# Patient Record
Sex: Male | Born: 1970 | Race: Black or African American | Marital: Single | Smoking: Current every day smoker
Health system: Southern US, Community
[De-identification: ages and names within clinical notes are randomized; demographics above are authoritative.]

## PROBLEM LIST (undated history)

## (undated) DIAGNOSIS — M1990 Unspecified osteoarthritis, unspecified site: Secondary | ICD-10-CM

## (undated) DIAGNOSIS — D649 Anemia, unspecified: Secondary | ICD-10-CM

## (undated) DIAGNOSIS — E559 Vitamin D deficiency, unspecified: Secondary | ICD-10-CM

## (undated) DIAGNOSIS — E7800 Pure hypercholesterolemia, unspecified: Secondary | ICD-10-CM

## (undated) DIAGNOSIS — I10 Essential (primary) hypertension: Secondary | ICD-10-CM

## (undated) HISTORY — DX: Vitamin D deficiency, unspecified: E55.9

## (undated) HISTORY — PX: OTHER SURGICAL HISTORY: SHX169

## (undated) HISTORY — DX: Unspecified osteoarthritis, unspecified site: M19.90

## (undated) HISTORY — DX: Anemia, unspecified: D64.9

## (undated) HISTORY — PX: WRIST RECONSTRUCTION: SHX2675

---

## 2012-02-20 ENCOUNTER — Emergency Department (HOSPITAL_COMMUNITY)
Admission: EM | Admit: 2012-02-20 | Discharge: 2012-02-20 | Disposition: A | Discharge status: Home / Self Care | Payer: Self-pay | Attending: Emergency Medicine | Admitting: Emergency Medicine

## 2012-02-20 ENCOUNTER — Encounter (HOSPITAL_COMMUNITY): Payer: Self-pay | Admitting: *Deleted

## 2012-02-20 ENCOUNTER — Emergency Department (HOSPITAL_COMMUNITY): Payer: Self-pay

## 2012-02-20 DIAGNOSIS — F17200 Nicotine dependence, unspecified, uncomplicated: Secondary | ICD-10-CM | POA: Insufficient documentation

## 2012-02-20 DIAGNOSIS — X500XXA Overexertion from strenuous movement or load, initial encounter: Secondary | ICD-10-CM | POA: Insufficient documentation

## 2012-02-20 DIAGNOSIS — Y929 Unspecified place or not applicable: Secondary | ICD-10-CM | POA: Insufficient documentation

## 2012-02-20 DIAGNOSIS — Z862 Personal history of diseases of the blood and blood-forming organs and certain disorders involving the immune mechanism: Secondary | ICD-10-CM | POA: Insufficient documentation

## 2012-02-20 DIAGNOSIS — Z8639 Personal history of other endocrine, nutritional and metabolic disease: Secondary | ICD-10-CM | POA: Insufficient documentation

## 2012-02-20 DIAGNOSIS — I10 Essential (primary) hypertension: Secondary | ICD-10-CM | POA: Insufficient documentation

## 2012-02-20 DIAGNOSIS — Y93F2 Activity, caregiving, lifting: Secondary | ICD-10-CM | POA: Insufficient documentation

## 2012-02-20 DIAGNOSIS — S335XXA Sprain of ligaments of lumbar spine, initial encounter: Secondary | ICD-10-CM | POA: Insufficient documentation

## 2012-02-20 DIAGNOSIS — S39012A Strain of muscle, fascia and tendon of lower back, initial encounter: Secondary | ICD-10-CM

## 2012-02-20 HISTORY — DX: Pure hypercholesterolemia, unspecified: E78.00

## 2012-02-20 HISTORY — DX: Essential (primary) hypertension: I10

## 2012-02-20 MED ORDER — NAPROXEN 500 MG PO TABS: 500.0000 mg | ORAL_TABLET | Freq: Two times a day (BID) | ORAL | Status: DC

## 2012-02-20 MED ORDER — HYDROCODONE-ACETAMINOPHEN 5-325 MG PO TABS: 1.0000 | ORAL_TABLET | Freq: Four times a day (QID) | ORAL | Status: DC | PRN

## 2012-02-20 MED ORDER — CYCLOBENZAPRINE HCL 10 MG PO TABS: 10.0000 mg | ORAL_TABLET | Freq: Two times a day (BID) | ORAL | Status: DC | PRN

## 2012-02-20 NOTE — ED Notes (Signed)
Pt was pushing a wheelbarrow and lifting boxes of tiles 2 weeks ago when he began having lower back pain.  Pain is greatest when he lays down or changes positions.

## 2012-02-20 NOTE — ED Provider Notes (Signed)
History  This chart was scribed for Shelda Jakes, MD by Erskine Emery, ED Scribe. This patient was seen in room TR05C/TR05C and the patient's care was started at 16:16.   CSN: [id number]  Arrival date & time 02/20/12  1552   First MD Initiated Contact with Patient 02/20/12 1616      Chief Complaint  Patient presents with  . Back Pain    (Consider location/radiation/quality/duration/timing/severity/associated sxs/prior treatment) The history is provided by the patient. No language interpreter was used.  Jeffrey Alvarez is a 42 y.o. male who presents to the Emergency Department complaining of gradually worsening constant lower right sided back pain that radiates to the right hip and right knee with a stabbing sensation for the past 2 weeks, since pushing a wheelbarrow and lifting heavy boxes. Pt reports the pain is about a 7.5/10 at this time. He claims it is aggravated by laying down or changing positions and used to be relieved by certain positions where he feels something move and getting warmed up by moving around. Pt denies any associated numbness or tingling in the feet, neck pain, chest pain, trouble breathing, nausea, emesis, diarrhea, fever, rash, visual disturbances, dysuria, or h/o bleeding easily. Pt reports a h/o pulled muscles in his back and several operations. He has been trying advil, tylenol, aleve, BC, and back rubs with no lasting relief from pain.  Past Medical History  Diagnosis Date  . Hypertension   . Hypercholesterolemia     Past Surgical History  Procedure Date  . Wrist reconstruction   . Knee reconsruction     L    No family history on file.  History  Substance Use Topics  . Smoking status: Current Every Day Smoker -- 0.5 packs/day    Types: Cigarettes  . Smokeless tobacco: Not on file  . Alcohol Use: Yes     Comment: weekends      Review of Systems  Constitutional: Negative for fever.  HENT: Negative for neck pain.   Eyes: Negative for visual  disturbance.  Respiratory: Negative for shortness of breath.   Cardiovascular: Negative for chest pain.  Gastrointestinal: Negative for nausea, vomiting, abdominal pain and diarrhea.  Genitourinary: Negative for dysuria.  Musculoskeletal: Positive for back pain.  Skin: Negative for rash.  Neurological: Negative for numbness.  Hematological: Does not bruise/bleed easily.  Psychiatric/Behavioral: Negative for sleep disturbance.  All other systems reviewed and are negative.    Allergies  Review of patient's allergies indicates no known allergies.  Home Medications   Current Outpatient Rx  Name  Route  Sig  Dispense  Refill  . CYCLOBENZAPRINE HCL 10 MG PO TABS   Oral   Take 1 tablet (10 mg total) by mouth 2 (two) times daily as needed for muscle spasms.   20 tablet   0   . HYDROCODONE-ACETAMINOPHEN 5-325 MG PO TABS   Oral   Take 1-2 tablets by mouth every 6 (six) hours as needed for pain.   20 tablet   0   . NAPROXEN 500 MG PO TABS   Oral   Take 1 tablet (500 mg total) by mouth 2 (two) times daily.   14 tablet   0     Triage Vitals: BP 157/100  Pulse 94  Temp 98.2 F (36.8 C) (Oral)  Resp 16  SpO2 94%  Physical Exam  Nursing note and vitals reviewed. Constitutional: He is oriented to person, place, and time. He appears well-developed and well-nourished. No distress.  HENT:  Head:  Normocephalic and atraumatic.  Mouth/Throat: Oropharynx is clear and moist.  Eyes: EOM are normal. Pupils are equal, round, and reactive to light.  Neck: Neck supple. No tracheal deviation present.  Cardiovascular: Normal rate, regular rhythm and normal heart sounds.   No murmur heard. Pulmonary/Chest: Effort normal and breath sounds normal. No respiratory distress. He has no wheezes.  Abdominal: Soft. Bowel sounds are normal. He exhibits no distension. There is no tenderness.  Musculoskeletal: Normal range of motion. He exhibits no edema.  Lymphadenopathy:    He has no cervical  adenopathy.  Neurological: He is alert and oriented to person, place, and time. No cranial nerve deficit. Coordination normal.  Skin: Skin is warm and dry.  Psychiatric: He has a normal mood and affect.    ED Course  Procedures (including critical care time) DIAGNOSTIC STUDIES: Oxygen Saturation is 94% on room air, adequate by my interpretation.    COORDINATION OF CARE: 16:30--I evaluated the patient and we discussed a treatment plan including back x-ray, consistent antiinflammatory medications, and narcotic pain medication to which the pt agreed.    Labs Reviewed - No data to display Dg Lumbar Spine Complete  02/20/2012  *RADIOLOGY REPORT*  Clinical Data: Low back pain.  LUMBAR SPINE - COMPLETE 4+ VIEW  Comparison: None.  Findings: AP, lateral and oblique images of the lumbar spine were obtained.  Normal alignment of the lumbar spine.  Vertebral body heights are maintained.  There is vacuum disc phenomenon and disc space narrowing at L5-S1.  No evidence for a fracture or dislocation.  IMPRESSION: Degenerative disc disease at L5-S1.  No acute bony abnormality.   Original Report Authenticated By: Richarda Overlie, M.D.      1. Lumbar strain       MDM   Patient's a lumbar x-rays are negative for any acute abnormality does have some degenerative disc disease L5-S1 symptoms are consistent with a lumbar strain O2 sat component. Patient given resource guide an orthopedic followup. Also had recommended maybe following up with the chiropractor. Patient retrieve the Flexeril pain medicine anti-inflammatories.      I personally performed the services described in this documentation, which was scribed in my presence. The recorded information has been reviewed and is accurate.     Shelda Jakes, MD 02/20/12 [phone]

## 2013-06-19 IMAGING — CR DG LUMBAR SPINE COMPLETE 4+V
5 series · 5 of 5 positions shown · non-contrast
Comparison: None.

CLINICAL DATA: Low back pain.

LUMBAR SPINE - COMPLETE 4+ VIEW

[t l-spine a.p.]
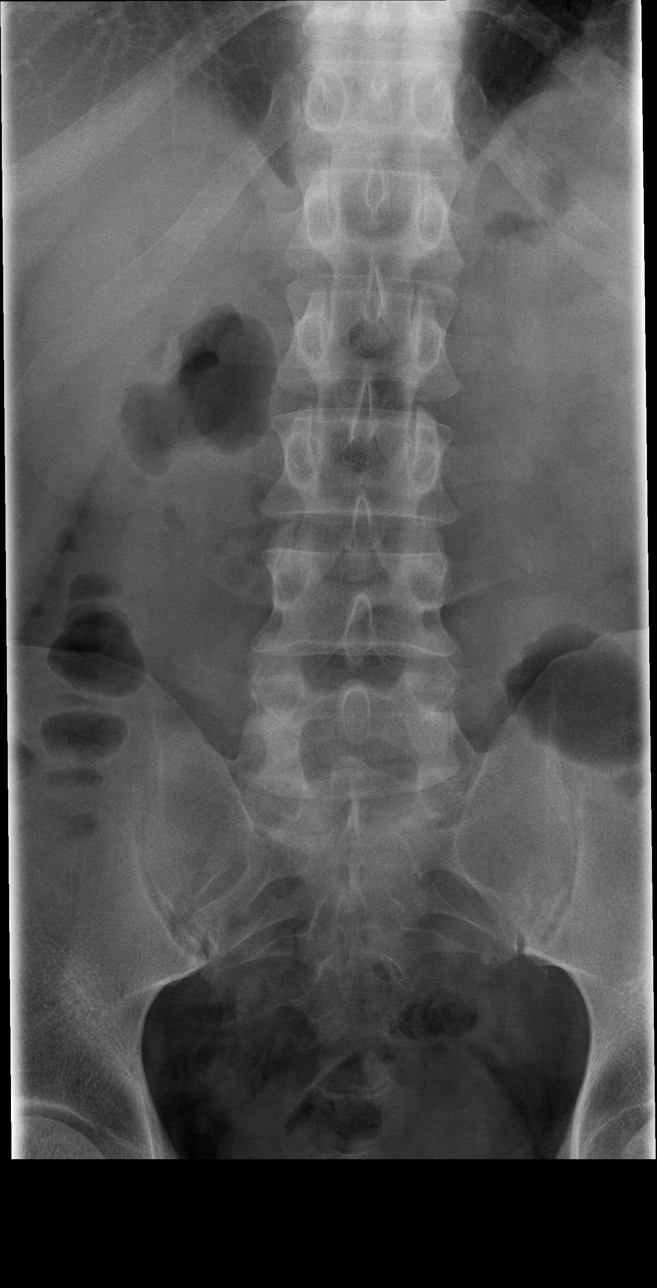

[t l-spine oblique exposure (1 of 2)]
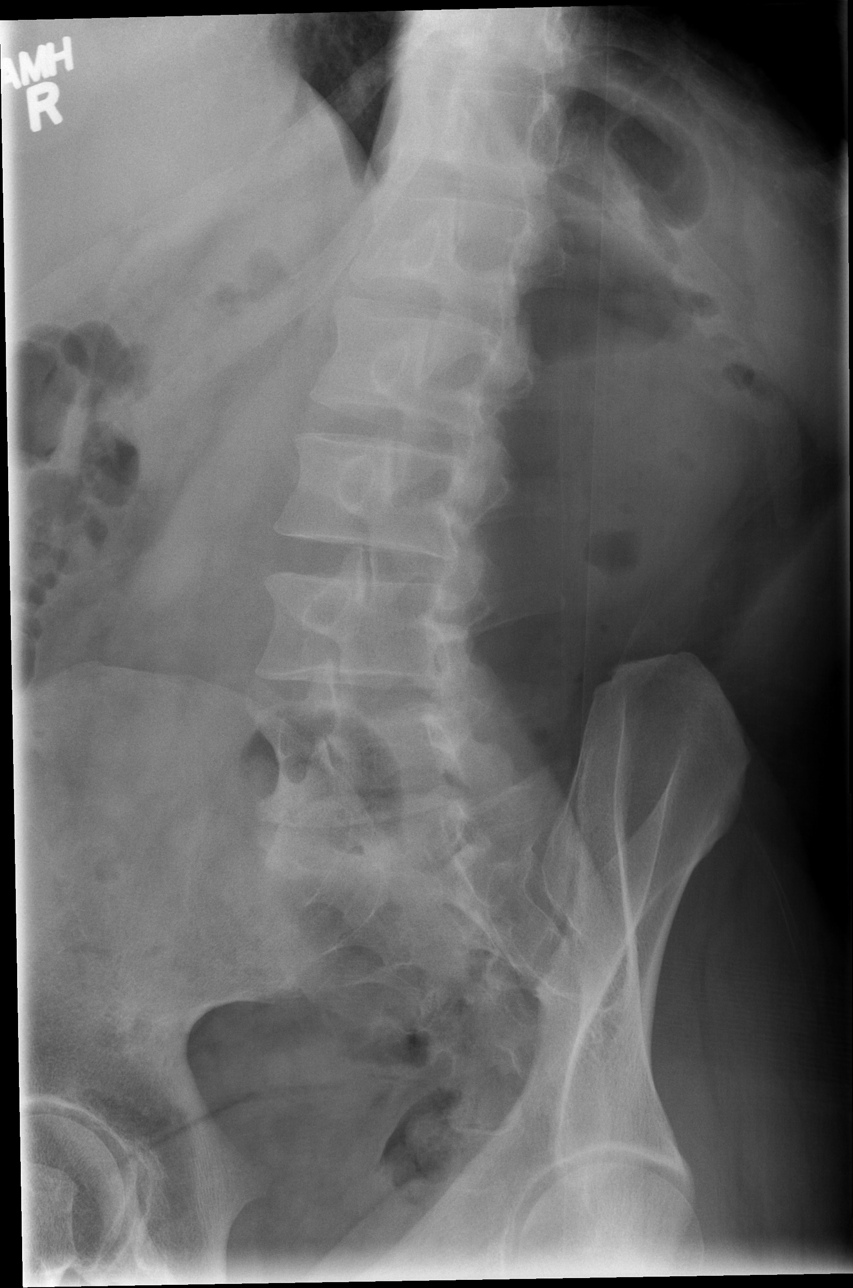

[t l-spine oblique exposure (2 of 2)]
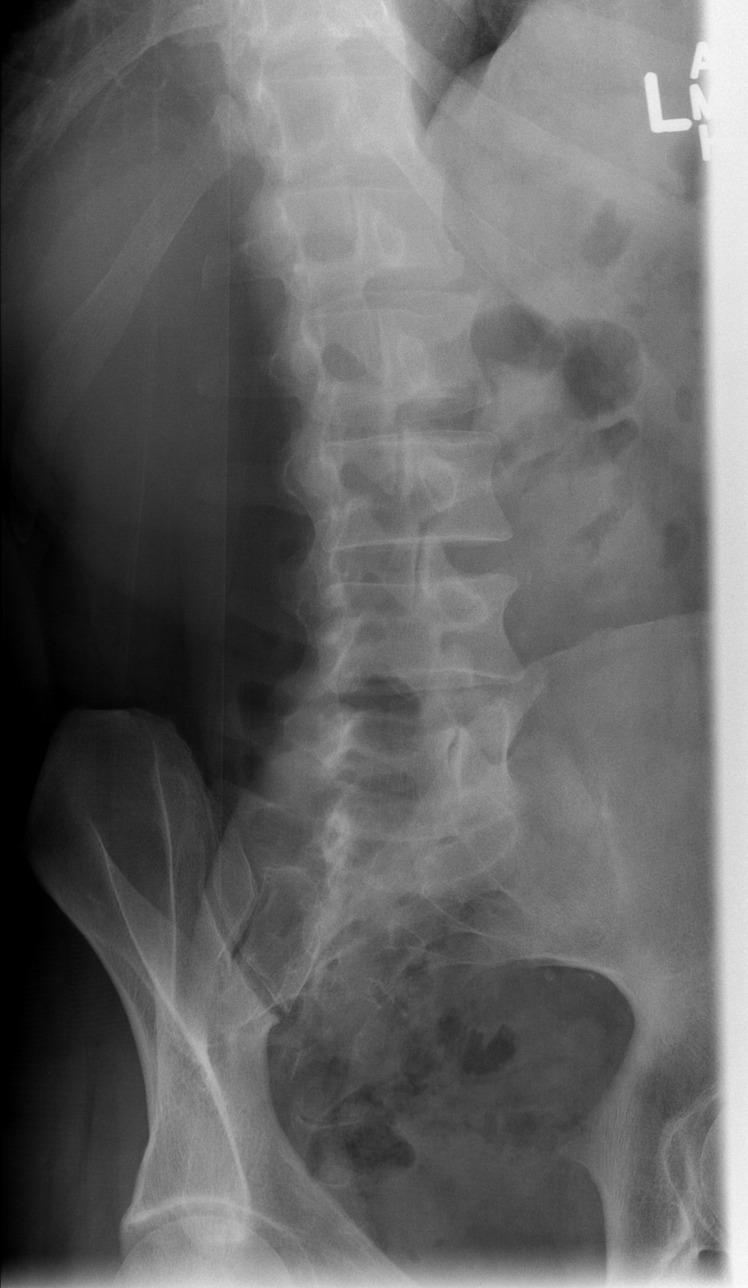

[t l-spine lat]
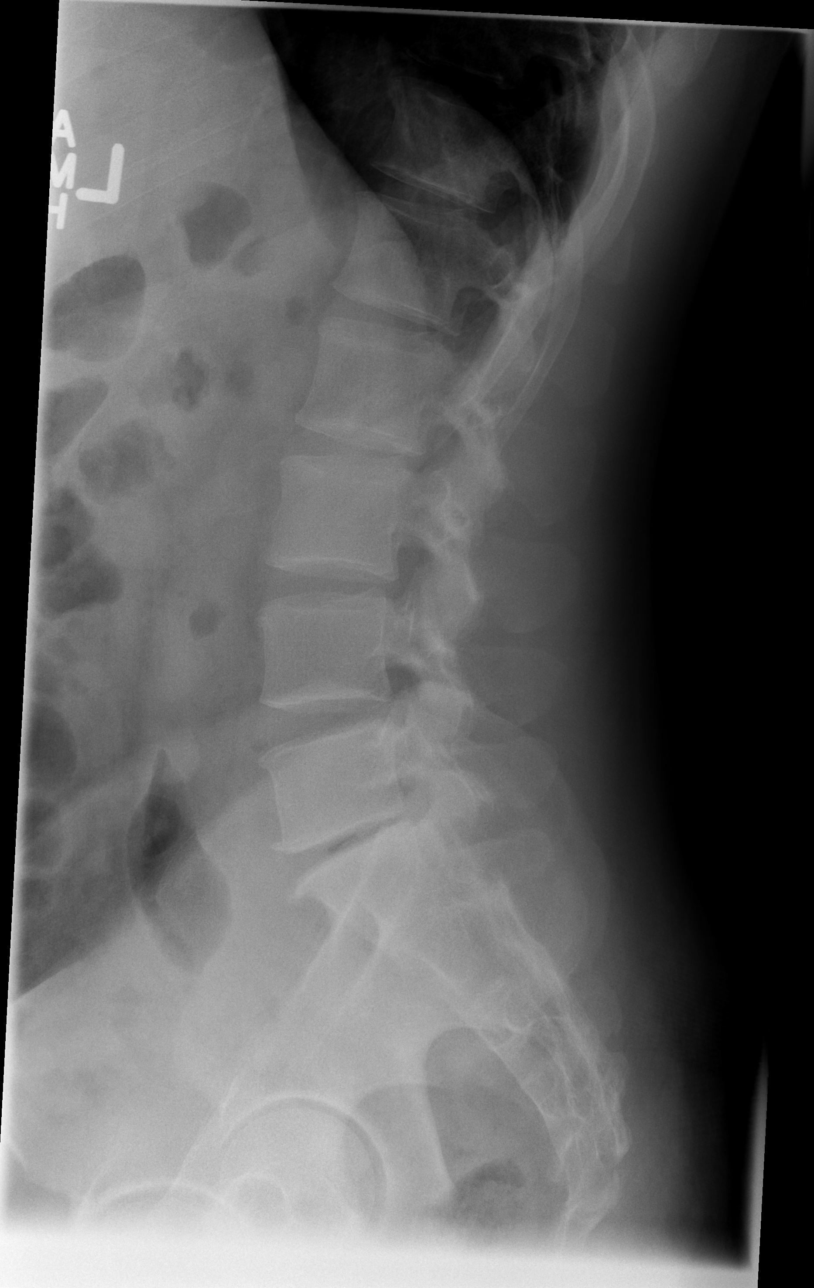

[t l-spine l5-s1 spot]
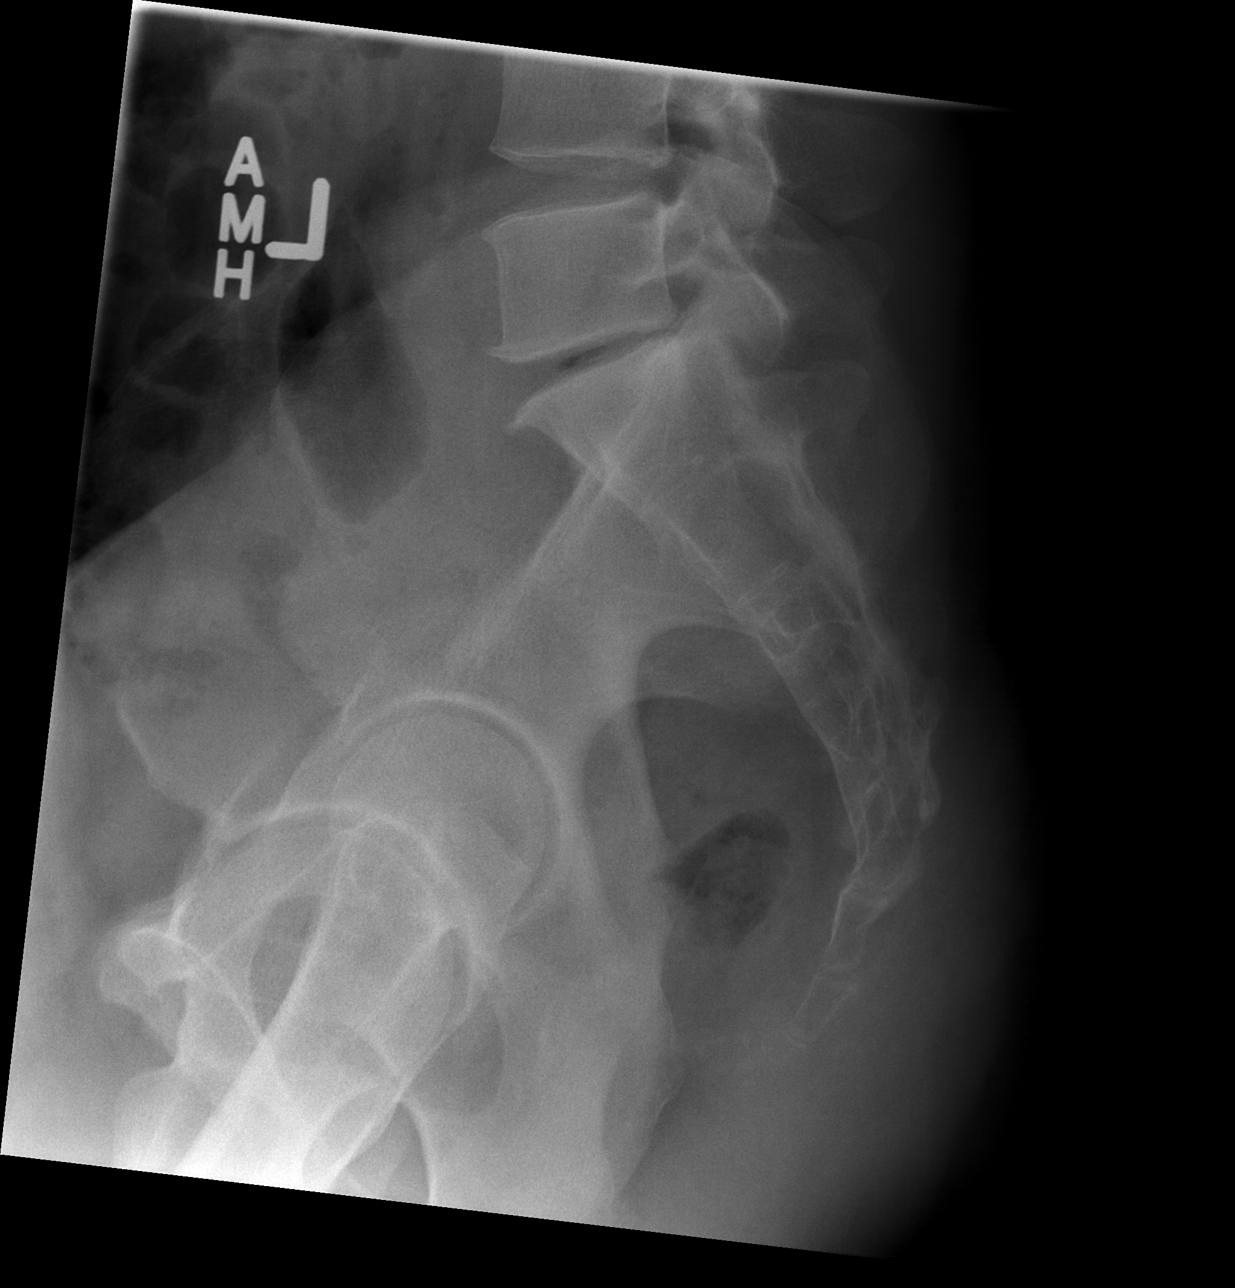

[5 of 5 positions shown; findings below may reference images not displayed]

FINDINGS: AP, lateral and oblique images of the lumbar spine were
obtained.  Normal alignment of the lumbar spine.  Vertebral body
heights are maintained.  There is vacuum disc phenomenon and disc
space narrowing at L5-S1.  No evidence for a fracture or
dislocation.
IMPRESSION: Degenerative disc disease at L5-S1.

No acute bony abnormality.

## 2022-08-17 NOTE — Progress Notes (Addendum)
[facility] [address] [place],  [place]  [address] [phone]  Clinic Day:  08/17/2022  Referring physician: Ellis Parents, FNP   REASON FOR CONSULTATION:  Thrombocytopenia  HISTORY OF PRESENT ILLNESS:  Jeffrey Alvarez is a 52 y.o. male with thrombocytopenia who is referred in consultation by Jeffrey Endo, FNP for assessment and management.  At a routine visit on July 23, the patient complained of weight loss, cough, and shortness of breath.  A CBC revealed a white count of 6.7 with 34% neutrophils, 58% lymphocytes, 7% monocytes and 1% eosinophils.  Hemoglobin was 13.4 with an MCV of 99.  Platelet count was 91,000, however, platelet clumping was seen.  Comprehensive metabolic panel was normal except for an AST of 79 and alkaline phosphatase of 203.  Iron panel, ferritin and B12 were normal.  Folic acid was borderline at 3.1 with greater than 3 being normal.  Hepatitis panel was negative.  CT chest did not reveal any acute cardiopulmonary abnormality.  No masses or nodules were seen.  Scattered pulmonary scarring was present.  Review of his medical records revealed he was seen in the ER at [facility] in March 2022 with cough, weakness and shortness of breath.  ABC revealed a white count of 4.6 with a normal differential, hemoglobin 13.8 and platelets 91,000.  Complete metabolic panel was normal except for ALT 140 and AST 370.  Chest x-ray did not reveal any acute abnormality.  7 mm right middle lobe nodule, felt to be possible nipple shadow was seen.  Medical history: Hypertension, hyperlipidemia, osteoarthritis, bipolar depression/anxiety and vitamin D deficiency. Status post left knee and right wrist surgeries.  Social history:  REVIEW OF SYSTEMS:  Review of Systems - Oncology   VITALS:  There were no vitals taken for this visit.  Wt Readings from Last 3 Encounters:  No data found for Wt    There is no height or weight on file to calculate  BMI.  Performance status (ECOG): {CHL ONC [id number]  PHYSICAL EXAM:  Physical Exam   LABS:       No data to display             No data to display           No results found for: "CEA1", "CEA" / No results found for: "CEA1", "CEA" No results found for: "PSA1" No results found for: "ZHY865" No results found for: "CAN125"  No results found for: "TOTALPROTELP", "ALBUMINELP", "A1GS", "A2GS", "BETS", "BETA2SER", "GAMS", "MSPIKE", "SPEI" No results found for: "TIBC", "FERRITIN", "IRONPCTSAT" No results found for: "LDH"  STUDIES:  No results found.    HISTORY:   Past Medical History:  Diagnosis Date   Hypercholesterolemia    Hypertension     Past Surgical History:  Procedure Laterality Date   knee reconsruction     L   WRIST RECONSTRUCTION      No family history on file.  Social History:  reports that he has been smoking cigarettes. He does not have any smokeless tobacco history on file. He reports current alcohol use. He reports that he does not use drugs.The patient is {Blank single:19197::"alone","accompanied by"} *** today.  Allergies: No Known Allergies  Current Medications: Current Outpatient Medications  Medication Sig Dispense Refill   cyclobenzaprine (FLEXERIL) 10 MG tablet Take 1 tablet (10 mg total) by mouth 2 (two) times daily as needed for muscle spasms. 20 tablet 0   HYDROcodone-acetaminophen (NORCO/VICODIN) 5-325 MG per tablet Take 1-2 tablets by  mouth every 6 (six) hours as needed for pain. 20 tablet 0   naproxen (NAPROSYN) 500 MG tablet Take 1 tablet (500 mg total) by mouth 2 (two) times daily. 14 tablet 0   No current facility-administered medications for this visit.     ASSESSMENT & PLAN:   Assessment:  Jeffrey Alvarez is a 52 y.o. male with thrombocytopenia, which is likely due to platelet clumping.  However, he has lymphocytosis, as well as macrocytosis.  The differential is fairly wide including infection, liver disease,  leukemia or myeloproliferative neoplasm.  Plan: 1.  Evaluate for nutritional and trace element deficiency, viral infection and liver disease, as well as leukemia with a CBC, CMP, B12, folate, copper, HIV, CMV, PT, PTT, fibrinogen, peripheral smear, flow cytometry on peripheral blood and abdominal ultrasound. 2.  Return in about 2 weeks to see Dr. Angelene Alvarez for results and further dilation/treatment recommendations.   I discussed the assessment and plan with the patient.  The patient was provided an opportunity to ask questions and all were answered.  The patient agreed with the plan and demonstrated an understanding of the instructions.  Was staffed and plan formulated with Dr. Angelene Alvarez.  Thank you for the referral.    I provided *** minutes of face-to-face time during this encounter and > 50% was spent counseling as documented under my assessment and plan.    Jeffrey Perl, PA-C   Physician Assistant [facility] [place] [phone]

## 2022-08-18 ENCOUNTER — Inpatient Hospital Stay: Payer: Medicaid Other | Attending: Hematology and Oncology | Admitting: Hematology and Oncology

## 2022-08-18 ENCOUNTER — Inpatient Hospital Stay: Payer: Medicaid Other

## 2022-08-18 ENCOUNTER — Encounter: Payer: Self-pay | Admitting: Hematology and Oncology

## 2022-08-18 VITALS — BP 197/102 | HR 56 | Temp 98.8°F | Resp 16 | Ht 71.5 in | Wt 146.4 lb

## 2022-08-18 DIAGNOSIS — D539 Nutritional anemia, unspecified: Secondary | ICD-10-CM | POA: Diagnosis present

## 2022-08-18 DIAGNOSIS — M1990 Unspecified osteoarthritis, unspecified site: Secondary | ICD-10-CM | POA: Diagnosis not present

## 2022-08-18 DIAGNOSIS — R7401 Elevation of levels of liver transaminase levels: Secondary | ICD-10-CM

## 2022-08-18 DIAGNOSIS — Z83438 Family history of other disorder of lipoprotein metabolism and other lipidemia: Secondary | ICD-10-CM | POA: Diagnosis not present

## 2022-08-18 DIAGNOSIS — Z809 Family history of malignant neoplasm, unspecified: Secondary | ICD-10-CM

## 2022-08-18 DIAGNOSIS — R748 Abnormal levels of other serum enzymes: Secondary | ICD-10-CM

## 2022-08-18 DIAGNOSIS — F17210 Nicotine dependence, cigarettes, uncomplicated: Secondary | ICD-10-CM | POA: Insufficient documentation

## 2022-08-18 DIAGNOSIS — F1290 Cannabis use, unspecified, uncomplicated: Secondary | ICD-10-CM | POA: Diagnosis not present

## 2022-08-18 DIAGNOSIS — J984 Other disorders of lung: Secondary | ICD-10-CM | POA: Diagnosis not present

## 2022-08-18 DIAGNOSIS — R7989 Other specified abnormal findings of blood chemistry: Secondary | ICD-10-CM | POA: Diagnosis not present

## 2022-08-18 DIAGNOSIS — R600 Localized edema: Secondary | ICD-10-CM | POA: Diagnosis not present

## 2022-08-18 DIAGNOSIS — E559 Vitamin D deficiency, unspecified: Secondary | ICD-10-CM | POA: Diagnosis not present

## 2022-08-18 DIAGNOSIS — R0602 Shortness of breath: Secondary | ICD-10-CM

## 2022-08-18 DIAGNOSIS — Z8249 Family history of ischemic heart disease and other diseases of the circulatory system: Secondary | ICD-10-CM | POA: Insufficient documentation

## 2022-08-18 DIAGNOSIS — Z5941 Food insecurity: Secondary | ICD-10-CM | POA: Diagnosis not present

## 2022-08-18 DIAGNOSIS — D649 Anemia, unspecified: Secondary | ICD-10-CM | POA: Diagnosis not present

## 2022-08-18 DIAGNOSIS — Z125 Encounter for screening for malignant neoplasm of prostate: Secondary | ICD-10-CM

## 2022-08-18 DIAGNOSIS — D72820 Lymphocytosis (symptomatic): Secondary | ICD-10-CM | POA: Diagnosis not present

## 2022-08-18 DIAGNOSIS — Z8041 Family history of malignant neoplasm of ovary: Secondary | ICD-10-CM | POA: Diagnosis not present

## 2022-08-18 DIAGNOSIS — Z8261 Family history of arthritis: Secondary | ICD-10-CM | POA: Insufficient documentation

## 2022-08-18 DIAGNOSIS — Z841 Family history of disorders of kidney and ureter: Secondary | ICD-10-CM | POA: Insufficient documentation

## 2022-08-18 DIAGNOSIS — D696 Thrombocytopenia, unspecified: Secondary | ICD-10-CM | POA: Diagnosis not present

## 2022-08-18 DIAGNOSIS — Z5982 Transportation insecurity: Secondary | ICD-10-CM | POA: Insufficient documentation

## 2022-08-18 DIAGNOSIS — Z833 Family history of diabetes mellitus: Secondary | ICD-10-CM | POA: Insufficient documentation

## 2022-08-18 DIAGNOSIS — Z803 Family history of malignant neoplasm of breast: Secondary | ICD-10-CM | POA: Diagnosis not present

## 2022-08-18 DIAGNOSIS — Z8042 Family history of malignant neoplasm of prostate: Secondary | ICD-10-CM | POA: Insufficient documentation

## 2022-08-18 DIAGNOSIS — Z8349 Family history of other endocrine, nutritional and metabolic diseases: Secondary | ICD-10-CM

## 2022-08-18 DIAGNOSIS — Z79899 Other long term (current) drug therapy: Secondary | ICD-10-CM | POA: Diagnosis not present

## 2022-08-18 DIAGNOSIS — Z808 Family history of malignant neoplasm of other organs or systems: Secondary | ICD-10-CM | POA: Insufficient documentation

## 2022-08-18 LAB — RETICULOCYTES
Immature Retic Fract: 12.4 % (ref 2.3–15.9)
RBC.: 4.17 MIL/uL — ABNORMAL LOW (ref 4.22–5.81)
Retic Count, Absolute: 84.7 10*3/uL (ref 19.0–186.0)
Retic Ct Pct: 2 % (ref 0.4–3.1)

## 2022-08-18 LAB — DIRECT ANTIGLOBULIN TEST (NOT AT ARMC)
DAT, IgG: NEGATIVE
DAT, complement: NEGATIVE

## 2022-08-18 LAB — CBC WITH DIFFERENTIAL (CANCER CENTER ONLY)
Abs Immature Granulocytes: 0.04 10*3/uL (ref 0.00–0.07)
Basophils Absolute: 0 10*3/uL (ref 0.0–0.1)
Basophils Relative: 0 %
Eosinophils Absolute: 0.1 10*3/uL (ref 0.0–0.5)
Eosinophils Relative: 1 %
HCT: 41.6 % (ref 39.0–52.0)
Hemoglobin: 13.7 g/dL (ref 13.0–17.0)
Immature Granulocytes: 0 %
Lymphocytes Relative: 29 %
Lymphs Abs: 2.8 10*3/uL (ref 0.7–4.0)
MCH: 33.3 pg (ref 26.0–34.0)
MCHC: 32.9 g/dL (ref 30.0–36.0)
MCV: 101.2 fL — ABNORMAL HIGH (ref 80.0–100.0)
Monocytes Absolute: 0.8 10*3/uL (ref 0.1–1.0)
Monocytes Relative: 8 %
Neutro Abs: 6 10*3/uL (ref 1.7–7.7)
Neutrophils Relative %: 62 %
Platelet Count: 106 10*3/uL — ABNORMAL LOW (ref 150–400)
RBC: 4.11 MIL/uL — ABNORMAL LOW (ref 4.22–5.81)
RDW: 15.2 % (ref 11.5–15.5)
WBC Count: 9.7 10*3/uL (ref 4.0–10.5)
nRBC: 0 % (ref 0.0–0.2)

## 2022-08-18 LAB — CMP (CANCER CENTER ONLY)
ALT: 24 U/L (ref 0–44)
AST: 36 U/L (ref 15–41)
Albumin: 3.8 g/dL (ref 3.5–5.0)
Alkaline Phosphatase: 117 U/L (ref 38–126)
Anion gap: 9 (ref 5–15)
BUN: 8 mg/dL (ref 6–20)
CO2: 24 mmol/L (ref 22–32)
Calcium: 9.2 mg/dL (ref 8.9–10.3)
Chloride: 108 mmol/L (ref 98–111)
Creatinine: 0.66 mg/dL (ref 0.61–1.24)
GFR, Estimated: >60 mL/min (ref >60)
Glucose, Bld: 88 mg/dL (ref 70–99)
Potassium: 3.8 mmol/L (ref 3.5–5.1)
Sodium: 141 mmol/L (ref 135–145)
Total Bilirubin: 1 mg/dL (ref 0.3–1.2)
Total Protein: 7.5 g/dL (ref 6.5–8.1)

## 2022-08-18 LAB — D-DIMER, QUANTITATIVE: D-Dimer, Quant: 1.23 ug/mL-FEU — ABNORMAL HIGH (ref 0.00–0.50)

## 2022-08-18 LAB — HIV ANTIBODY (ROUTINE TESTING W REFLEX): HIV Screen 4th Generation wRfx: NONREACTIVE

## 2022-08-18 LAB — TECHNOLOGIST SMEAR REVIEW: Plt Morphology: DECREASED

## 2022-08-18 LAB — LACTATE DEHYDROGENASE: LDH: 183 U/L (ref 98–192)

## 2022-08-18 LAB — VITAMIN B12: Vitamin B-12: 266 pg/mL (ref 180–914)

## 2022-08-18 LAB — FOLATE: Folate: 7.1 ng/mL (ref >5.9)

## 2022-08-18 LAB — FIBRINOGEN: Fibrinogen: 322 mg/dL (ref 210–475)

## 2022-08-18 LAB — PROTIME-INR
INR: 1.2 (ref 0.8–1.2)
Prothrombin Time: 14.9 seconds (ref 11.4–15.2)

## 2022-08-18 LAB — FERRITIN: Ferritin: 58 ng/mL (ref 24–336)

## 2022-08-18 LAB — APTT: aPTT: 27 seconds (ref 24–36)

## 2022-08-19 ENCOUNTER — Telehealth: Payer: Self-pay | Admitting: Hematology and Oncology

## 2022-08-19 ENCOUNTER — Telehealth: Payer: Self-pay

## 2022-08-19 NOTE — Progress Notes (Signed)
Sent in request for DOS 08/19/2022 to [employer].

## 2022-08-19 NOTE — Telephone Encounter (Signed)
Attempted to contact patient to let him know he does not have evidence of DVT or pulmonary embolism.  Phone number listed on the chart was not in service.

## 2022-08-19 NOTE — Telephone Encounter (Signed)
08/19/22 Spoke with patient and scheduled CTA Chest and Bilat venous doppler on 08/19/22@1pm 

## 2022-08-19 NOTE — Telephone Encounter (Signed)
Pt walked into clinic after his scan. He c/o bilateral legs & feet hurting him. He was wondering if you could give him something for pain to get him through the weekend. Tylenol & ibuprofen do not relieve the pain. Pt has on compression stockings.He states when he takes the stockings off his feet swell. His PCP closed for weekend, which is [facility] @ [facility].  Please advise.

## 2022-08-23 ENCOUNTER — Encounter: Payer: Self-pay | Admitting: Hematology and Oncology

## 2022-08-23 ENCOUNTER — Telehealth: Payer: Self-pay

## 2022-08-23 ENCOUNTER — Other Ambulatory Visit: Payer: Self-pay | Admitting: Hematology and Oncology

## 2022-08-23 DIAGNOSIS — E610 Copper deficiency: Secondary | ICD-10-CM | POA: Insufficient documentation

## 2022-08-23 DIAGNOSIS — F1010 Alcohol abuse, uncomplicated: Secondary | ICD-10-CM

## 2022-08-23 DIAGNOSIS — D696 Thrombocytopenia, unspecified: Secondary | ICD-10-CM

## 2022-08-23 MED ORDER — COPPER 5 MG PO TABS: 2.5000 mg | ORAL_TABLET | Freq: Every day | ORAL | 1 refills | Status: DC

## 2022-08-23 NOTE — Telephone Encounter (Signed)
-----   Message from Jeffrey Alvarez sent at 08/23/2022 10:10 AM EDT ----- Please let him know his copper level was low. I sent him in a prescription for copper 5mg  to take 1/2 tab daily. We will get labs again at his next visit. Thanks

## 2022-08-23 NOTE — Telephone Encounter (Signed)
Patient notified and voiced understanding.

## 2022-08-31 NOTE — Progress Notes (Signed)
Sent in request for DOS 09/01/2022 to [employer].

## 2022-09-01 ENCOUNTER — Other Ambulatory Visit: Payer: Self-pay | Admitting: Pharmacist

## 2022-09-01 ENCOUNTER — Inpatient Hospital Stay (INDEPENDENT_AMBULATORY_CARE_PROVIDER_SITE_OTHER): Payer: Medicaid Other | Admitting: Oncology

## 2022-09-01 ENCOUNTER — Inpatient Hospital Stay: Payer: Medicaid Other

## 2022-09-01 VITALS — BP 167/98 | HR 63 | Temp 98.2°F | Resp 18 | Ht 71.5 in | Wt 144.9 lb

## 2022-09-01 DIAGNOSIS — E610 Copper deficiency: Secondary | ICD-10-CM

## 2022-09-01 DIAGNOSIS — F1010 Alcohol abuse, uncomplicated: Secondary | ICD-10-CM

## 2022-09-01 DIAGNOSIS — R634 Abnormal weight loss: Secondary | ICD-10-CM

## 2022-09-01 DIAGNOSIS — D696 Thrombocytopenia, unspecified: Secondary | ICD-10-CM

## 2022-09-01 DIAGNOSIS — D539 Nutritional anemia, unspecified: Secondary | ICD-10-CM | POA: Diagnosis not present

## 2022-09-01 LAB — CMP (CANCER CENTER ONLY)
ALT: 21 U/L (ref 0–44)
AST: 37 U/L (ref 15–41)
Albumin: 3.7 g/dL (ref 3.5–5.0)
Alkaline Phosphatase: 124 U/L (ref 38–126)
Anion gap: 13 (ref 5–15)
BUN: 7 mg/dL (ref 6–20)
CO2: 24 mmol/L (ref 22–32)
Calcium: 9.6 mg/dL (ref 8.9–10.3)
Chloride: 104 mmol/L (ref 98–111)
Creatinine: 0.75 mg/dL (ref 0.61–1.24)
GFR, Estimated: >60 mL/min (ref >60)
Glucose, Bld: 81 mg/dL (ref 70–99)
Potassium: 4.2 mmol/L (ref 3.5–5.1)
Sodium: 141 mmol/L (ref 135–145)
Total Bilirubin: 0.6 mg/dL (ref 0.3–1.2)
Total Protein: 7.7 g/dL (ref 6.5–8.1)

## 2022-09-01 LAB — BRAIN NATRIURETIC PEPTIDE: B Natriuretic Peptide: 405.8 pg/mL — ABNORMAL HIGH (ref 0.0–100.0)

## 2022-09-01 LAB — CORTISOL: Cortisol, Plasma: 12.1 ug/dL

## 2022-09-01 NOTE — Progress Notes (Signed)
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                         Dermatomyositis            20 - 40% ---------------------   ------------------------   --------- Centromere B             Scleroderma -                           Crest                         variant                         80% Performed At: [facility] [address] [place], [place] [id number] Jolene Schimke MD WU:[id number]    Rheumatoid fact SerPl-aCnc 08/18/2022 10.6  <14.0 IU/mL Final   Comment: (NOTE) Performed At: [facility] [address] [place], [place] [id number] Jolene Schimke MD VH:[id number]    WBC MORPHOLOGY 08/18/2022 VACUOLATED NEUTROPHILS   Final   RBC MORPHOLOGY 08/18/2022 BURR CELLS   Final   Plt Morphology 08/18/2022 PLATELETS APPEAR DECREASED   Final   Clinical Information 08/18/2022 thrombocytopenia   Final   Performed at [facility], [address] [place], [place] [address]   Copper 08/18/2022 28 (L)  69 - 132 ug/dL Final   Comment: (NOTE) This test was developed and its performance characteristics determined by [facility]. It has not been cleared or approved by the Food and Drug Administration.                                Detection Limit = 5 Performed At: [facility] [address] [place], [place] [id number] Jolene Schimke MD GU:[id number]    DAT, complement 08/18/2022 NEG   Final   DAT, IgG 08/18/2022    Final                   Value:NEG Performed at [facility], [address], [place], [place] [address]    Haptoglobin 08/18/2022 104  29 - 370 mg/dL Final   Comment: (NOTE) Performed At: [facility] [address] [place], [place] [id number] Jolene Schimke MD OA:[id number]    Fibrinogen 08/18/2022 322  210 - 475 mg/dL Final   Comment: (NOTE) Fibrinogen results may be underestimated in patients receiving thrombolytic therapy. Performed at [facility], [address] [place], [place] [address]    HIV Screen 4th Generation wRfx 08/18/2022 Non Reactive  Non Reactive Final   Performed at [facility], [address] [place], [place] [address]   QuantiFERON Incubation 08/18/2022 Incubation performed.   Final   QuantiFERON-TB Gold Plus 08/18/2022 Negative  Negative Final   Comment: (NOTE) No  response to M tuberculosis antigens detected. Infection with M tuberculosis is unlikely, but high risk individuals should be considered for additional testing (ATS/IDSA/CDC Clinical Practice Guidelines, 2017). The reference range is an Antigen minus Nil result of <0.35 IU/mL. Chemiluminescence immunoassay methodology Performed At: [facility] [address] [place], [place] [id number] Jolene Schimke MD CW:[id number]    D-Dimer, Quant 08/18/2022 1.23 (H)  0.00 - 0.50 ug/mL-FEU Final   Comment: (NOTE) At the manufacturer cut-off value of 0.5 g/mL FEU, this assay has a negative  predictive value of 95-100%.This assay is intended for use in conjunction with a clinical pretest probability (PTP) assessment model to exclude pulmonary embolism (PE) and deep venous thrombosis (DVT) in outpatients suspected of PE or DVT. Results should be correlated with clinical presentation. Performed at [facility], [address], [place], [place] [address]    Retic Ct Pct 08/18/2022 2.0  0.4 - 3.1 % Final   RBC. 08/18/2022 4.17 (L)  4.22 - 5.81 MIL/uL Final   Retic Count, Absolute 08/18/2022 84.7  19.0 - 186.0 K/uL Final   Immature Retic Fract 08/18/2022 12.4  2.3 - 15.9 % Final   Performed at [facility], [address] [place], [place] [address]   Zinc 08/18/2022 63  44 - 115 ug/dL Final   Comment: (NOTE) This test was developed and its performance characteristics determined by [facility]. It has not been cleared or approved by the Food and Drug Administration.                                Detection Limit = 5 Performed At: [facility] [address] [place], [place] [id number] Jolene Schimke MD WG:[id number]    Prostate Specific Ag, Serum 08/18/2022 0.4  0.0 - 4.0 ng/mL Final   Comment: (NOTE) Roche ECLIA methodology. According to the American Urological Association, Serum PSA should decrease and remain at undetectable levels after  radical prostatectomy. The AUA defines biochemical recurrence as an initial PSA value 0.2 ng/mL or greater followed by a subsequent confirmatory PSA value 0.2 ng/mL or greater. Values obtained with different assay methods or kits cannot be used interchangeably. Results cannot be interpreted as absolute evidence of the presence or absence of malignant disease. Performed At: [facility] [address] [place], [place] [id number] Jolene Schimke MD MW:[id number]    QuantiFERON Criteria 08/18/2022 Comment   Final   Comment: (NOTE) QuantiFERON-TB Gold Plus is a qualitative indirect test for M tuberculosis infection (including disease) and is intended for use in conjunction with risk assessment, radiography, and other medical and diagnostic evaluations. The QuantiFERON-TB Gold Plus result is determined by subtracting the Nil value from either TB antigen (Ag) value. The Mitogen tube serves as a control for the test.    QuantiFERON TB1 Ag Value 08/18/2022 0.02  IU/mL Final   QuantiFERON TB2 Ag Value 08/18/2022 0.03  IU/mL Final   QuantiFERON Nil Value 08/18/2022 0.03  IU/mL Final   QuantiFERON Mitogen Value 08/18/2022 >10.00  IU/mL Final   Comment: (NOTE) Performed At: [facility] [address] [place], [place] [id number] Jolene Schimke MD HK:[id number]     RADIOGRAPHIC STUDIES: I have personally reviewed the radiological images as listed and agree with the findings in the report  No results found.  ASSESSMENT/PLAN 52 y.o. male is here because of thrombocytopenia.  Medical history: Hypertension, hyperlipidemia, osteoarthritis, depression/anxiety and vitamin D deficiency. Status post left knee, right wrist and right hand surgeries.  He has never had a screening colonoscopy.   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   Bone marrow malignancies AML MDS MPD,  PN[facility]   Bone marrow suppression Drug induced Ibuprofen, Naproxen EtOH   Congenital Bernard-Soulier Syndrome Fanconi anemia Platelet type or pseudo von-Willebrand disease Wiskott-Aldrich Syndrome Bernard-Soulier syndrome May-Hegglin anomaly   Infection     Viral    Parasitic:  Babesiosis   Neoplastic marrow infiltration Solid tumor    Lymphoid   Nutritional Deficiencies Copper  Increased Destruction   Autoimmune Syndrome ITP, APLAS, Sarcoid   DIC Infection, Malignancy   Infection Bacterial Fungal Parasitic Malaria Viral  CMV, EBV, parvo B19,    Mechanical Destruction   Sequestration   Hypersplenism    Liver Disease Cirrhosis Fibrosis Steatohepatitis Portal HTN   Pulmonary HTN   Pseudothrombocytopenia   Antibody induced Cold Agglutinin   Spurious EDTA induced clumping Inadequate specimen anticoagulation Giant platelets counted as WBC's    Most likely etiologies in this patient are 1) direct marrow toxicity from EtOH 2) chronic liver disease from EtOH 3) copper deficiency  Will obtain abdominal U/S and arrange for IV copper.       Cancer Staging  No matching staging information was found for the patient.    No problem-specific Assessment & Plan notes found for this encounter.    Orders Placed This Encounter  Procedures   US Abdomen Complete    Standing Status:   Future    Standing Expiration Date:   03/04/2023    Order Specific Question:   Reason for Exam (SYMPTOM  OR DIAGNOSIS REQUIRED)    Answer:   weight loss    Order Specific Question:   Preferred imaging location?    Answer:   External   Kappa/lambda light chains    Standing Status:   Future    Number of Occurrences:   1    Standing Expiration Date:   09/01/2023   Multiple Myeloma Panel (SPEP&IFE w/QIG)    Standing Status:   Future    Number of Occurrences:   1    Standing Expiration Date:   09/01/2023   QuantiFERON-TB Gold Plus    Standing  Status:   Future    Standing Expiration Date:   09/01/2023   Rheumatoid factor    Standing Status:   Future    Number of Occurrences:   1    Standing Expiration Date:   09/01/2023   ANA Comprehensive Panel    Standing Status:   Future    Number of Occurrences:   1    Standing Expiration Date:   09/01/2023   QuantiFERON-TB Gold Plus    Standing Status:   Future    Number of Occurrences:   1    Standing Expiration Date:   09/01/2023   BNP (Brain natriuretic peptide)    Standing Status:   Future    Number of Occurrences:   1    Standing Expiration Date:   09/01/2023   Cortisol    Standing Status:   Future    Number of Occurrences:   1    Standing Expiration Date:   09/01/2023    60  minutes was spent in patient care.  This included time spent preparing to see the patient (e.g., review of tests), obtaining and/or reviewing separately obtained history, counseling and educating the patient/family/caregiver, ordering medications, tests, or procedures; documenting clinical information in the electronic or other health record, independently interpreting results and communicating results to the patient/family/caregiver as well as coordination of care.       All questions were answered. The patient knows to call the clinic with any problems, questions or concerns.  This note was electronically signed.    Loni Muse, MD  09/21/2022 10:31 AM

## 2022-09-02 LAB — ANA COMPREHENSIVE PANEL
Anti JO-1: <0.2 AI (ref 0.0–0.9)
Centromere Ab Screen: <0.2 AI (ref 0.0–0.9)
Chromatin Ab SerPl-aCnc: <0.2 AI (ref 0.0–0.9)
ENA SM Ab Ser-aCnc: <0.2 AI (ref 0.0–0.9)
Ribonucleic Protein: <0.2 AI (ref 0.0–0.9)
SSA (Ro) (ENA) Antibody, IgG: <0.2 AI (ref 0.0–0.9)
SSB (La) (ENA) Antibody, IgG: <0.2 AI (ref 0.0–0.9)
Scleroderma (Scl-70) (ENA) Antibody, IgG: <0.2 AI (ref 0.0–0.9)
ds DNA Ab: 1 [IU]/mL (ref 0–9)

## 2022-09-02 LAB — KAPPA/LAMBDA LIGHT CHAINS
Kappa free light chain: 21.9 mg/L — ABNORMAL HIGH (ref 3.3–19.4)
Kappa, lambda light chain ratio: 0.78 (ref 0.26–1.65)
Lambda free light chains: 28 mg/L — ABNORMAL HIGH (ref 5.7–26.3)

## 2022-09-02 NOTE — Progress Notes (Signed)
Sent in request for DOS 09/06/2022 to [employer].

## 2022-09-03 LAB — RHEUMATOID FACTOR: Rheumatoid fact SerPl-aCnc: 10.9 IU/mL (ref <14.0)

## 2022-09-03 LAB — COPPER, SERUM: Copper: 34 ug/dL — ABNORMAL LOW (ref 69–132)

## 2022-09-05 LAB — QUANTIFERON-TB GOLD PLUS (RQFGPL)
QuantiFERON Mitogen Value: >10 [IU]/mL
QuantiFERON Nil Value: 0.01 [IU]/mL
QuantiFERON TB1 Ag Value: 0.02 [IU]/mL
QuantiFERON TB2 Ag Value: 0.02 [IU]/mL

## 2022-09-05 LAB — QUANTIFERON-TB GOLD PLUS: QuantiFERON-TB Gold Plus: NEGATIVE

## 2022-09-06 ENCOUNTER — Telehealth: Payer: Self-pay | Admitting: Oncology

## 2022-09-06 LAB — MULTIPLE MYELOMA PANEL, SERUM
Albumin SerPl Elph-Mcnc: 3.8 g/dL (ref 2.9–4.4)
Albumin/Glob SerPl: 1.4 (ref 0.7–1.7)
Alpha 1: 0.2 g/dL (ref 0.0–0.4)
Alpha2 Glob SerPl Elph-Mcnc: 0.5 g/dL (ref 0.4–1.0)
B-Globulin SerPl Elph-Mcnc: 0.9 g/dL (ref 0.7–1.3)
Gamma Glob SerPl Elph-Mcnc: 1.3 g/dL (ref 0.4–1.8)
Globulin, Total: 2.9 g/dL (ref 2.2–3.9)
IgA: 541 mg/dL — ABNORMAL HIGH (ref 90–386)
IgG (Immunoglobin G), Serum: 1406 mg/dL (ref 603–1613)
IgM (Immunoglobulin M), Srm: 66 mg/dL (ref 20–172)
Total Protein ELP: 6.7 g/dL (ref 6.0–8.5)

## 2022-09-06 LAB — VITAMIN B1: Vitamin B1 (Thiamine): 69.5 nmol/L (ref 66.5–200.0)

## 2022-09-06 NOTE — Progress Notes (Signed)
Sent in request for DOS 09/12/2022, 09/13/2022, 09/14/2022, 09/15/2022, and 09/16/2022 to [employer].

## 2022-09-06 NOTE — Telephone Encounter (Signed)
09/06/22 Spoke with patient and confirmed next appts

## 2022-09-08 LAB — VITAMIN B6: Vitamin B6: 13.3 ug/L (ref 3.4–65.2)

## 2022-09-09 MED FILL — Cupric Chloride Inj 0.4 MG/ML (Elemental): INTRAVENOUS | Qty: 5 | Status: AC

## 2022-09-12 ENCOUNTER — Inpatient Hospital Stay: Payer: Medicaid Other

## 2022-09-12 VITALS — BP 159/79 | HR 60 | Temp 98.7°F | Resp 20 | Ht 71.5 in | Wt 147.0 lb

## 2022-09-12 DIAGNOSIS — E610 Copper deficiency: Secondary | ICD-10-CM

## 2022-09-12 DIAGNOSIS — D539 Nutritional anemia, unspecified: Secondary | ICD-10-CM | POA: Diagnosis not present

## 2022-09-12 MED ORDER — ACETAMINOPHEN 325 MG PO TABS: 650.0000 mg | ORAL_TABLET | Freq: Once | ORAL | Status: DC

## 2022-09-12 MED ORDER — LORATADINE 10 MG PO TABS
10.0000 mg | ORAL_TABLET | Freq: Every day | ORAL | Status: DC
  Administered 2022-09-12: 10 mg via ORAL
  Filled 2022-09-12: qty 1

## 2022-09-12 MED ORDER — SODIUM CHLORIDE 0.9 % IV SOLN
2.0000 mg | Freq: Every day | INTRAVENOUS | Status: DC
  Filled 2022-09-12: qty 5

## 2022-09-12 MED ORDER — ONDANSETRON HCL 4 MG/2ML IJ SOLN
8.0000 mg | Freq: Every day | INTRAMUSCULAR | Status: DC
  Administered 2022-09-12: 8 mg via INTRAVENOUS
  Filled 2022-09-12: qty 4

## 2022-09-12 MED ORDER — SODIUM CHLORIDE 0.9 % IV SOLN
2.0000 mg | Freq: Every day | INTRAVENOUS | Status: DC
  Administered 2022-09-12: 2 mg via INTRAVENOUS
  Filled 2022-09-12: qty 5

## 2022-09-12 MED ORDER — SODIUM CHLORIDE 0.9 % IV SOLN: Freq: Once | INTRAVENOUS | Status: AC

## 2022-09-12 MED FILL — Cupric Chloride Inj 0.4 MG/ML (Elemental): INTRAVENOUS | Qty: 5 | Status: AC

## 2022-09-12 NOTE — Patient Instructions (Signed)
Copper infusion daily x 5 Call with any probelms

## 2022-09-13 ENCOUNTER — Inpatient Hospital Stay: Payer: Medicaid Other

## 2022-09-13 VITALS — BP 146/78 | HR 60 | Temp 98.8°F | Resp 12 | Ht 71.5 in | Wt 148.1 lb

## 2022-09-13 DIAGNOSIS — E610 Copper deficiency: Secondary | ICD-10-CM

## 2022-09-13 DIAGNOSIS — D539 Nutritional anemia, unspecified: Secondary | ICD-10-CM | POA: Diagnosis not present

## 2022-09-13 MED ORDER — SODIUM CHLORIDE 0.9 % IV SOLN: Freq: Once | INTRAVENOUS | Status: AC

## 2022-09-13 MED ORDER — LORATADINE 10 MG PO TABS
10.0000 mg | ORAL_TABLET | Freq: Every day | ORAL | Status: DC
  Administered 2022-09-13: 10 mg via ORAL
  Filled 2022-09-13: qty 1

## 2022-09-13 MED ORDER — SODIUM CHLORIDE 0.9 % IV SOLN
2.0000 mg | Freq: Every day | INTRAVENOUS | Status: DC
  Administered 2022-09-13: 2 mg via INTRAVENOUS
  Filled 2022-09-13: qty 5

## 2022-09-13 MED ORDER — ACETAMINOPHEN 325 MG PO TABS: 650.0000 mg | ORAL_TABLET | Freq: Once | ORAL | Status: DC

## 2022-09-13 MED ORDER — ONDANSETRON HCL 4 MG/2ML IJ SOLN
8.0000 mg | Freq: Every day | INTRAMUSCULAR | Status: DC
  Administered 2022-09-13: 8 mg via INTRAVENOUS
  Filled 2022-09-13: qty 4

## 2022-09-13 MED FILL — Cupric Chloride Inj 0.4 MG/ML (Elemental): INTRAVENOUS | Qty: 5 | Status: AC

## 2022-09-13 NOTE — Patient Instructions (Signed)
Call with any problems  [phone]

## 2022-09-14 ENCOUNTER — Encounter: Payer: Self-pay | Admitting: Hematology and Oncology

## 2022-09-14 ENCOUNTER — Inpatient Hospital Stay: Payer: Medicaid Other

## 2022-09-14 VITALS — BP 132/74 | HR 53 | Temp 98.0°F | Resp 16

## 2022-09-14 DIAGNOSIS — D539 Nutritional anemia, unspecified: Secondary | ICD-10-CM | POA: Diagnosis not present

## 2022-09-14 DIAGNOSIS — E610 Copper deficiency: Secondary | ICD-10-CM

## 2022-09-14 MED ORDER — SODIUM CHLORIDE 0.9 % IV SOLN: Freq: Once | INTRAVENOUS | Status: AC

## 2022-09-14 MED ORDER — ACETAMINOPHEN 325 MG PO TABS
650.0000 mg | ORAL_TABLET | Freq: Once | ORAL | Status: AC
  Administered 2022-09-14: 650 mg via ORAL

## 2022-09-14 MED ORDER — ONDANSETRON HCL 4 MG/2ML IJ SOLN
8.0000 mg | Freq: Every day | INTRAMUSCULAR | Status: DC
  Administered 2022-09-14: 8 mg via INTRAVENOUS
  Filled 2022-09-14: qty 4

## 2022-09-14 MED ORDER — SODIUM CHLORIDE 0.9 % IV SOLN
2.0000 mg | Freq: Every day | INTRAVENOUS | Status: DC
  Administered 2022-09-14: 2 mg via INTRAVENOUS
  Filled 2022-09-14: qty 5

## 2022-09-14 MED ORDER — LORATADINE 10 MG PO TABS
10.0000 mg | ORAL_TABLET | Freq: Every day | ORAL | Status: DC
  Administered 2022-09-14: 10 mg via ORAL
  Filled 2022-09-14: qty 1

## 2022-09-14 MED FILL — Cupric Chloride Inj 0.4 MG/ML (Elemental): INTRAVENOUS | Qty: 5 | Status: AC

## 2022-09-14 NOTE — Patient Instructions (Signed)
Call with any problems Return tomorrow [phone]

## 2022-09-15 ENCOUNTER — Inpatient Hospital Stay: Payer: Medicaid Other

## 2022-09-15 VITALS — BP 129/80 | HR 61 | Temp 98.2°F | Resp 16 | Ht 71.5 in | Wt 149.1 lb

## 2022-09-15 DIAGNOSIS — E610 Copper deficiency: Secondary | ICD-10-CM

## 2022-09-15 DIAGNOSIS — D539 Nutritional anemia, unspecified: Secondary | ICD-10-CM | POA: Diagnosis not present

## 2022-09-15 MED ORDER — SODIUM CHLORIDE 0.9 % IV SOLN
2.0000 mg | Freq: Every day | INTRAVENOUS | Status: DC
  Administered 2022-09-15: 2 mg via INTRAVENOUS
  Filled 2022-09-15: qty 5

## 2022-09-15 MED ORDER — SODIUM CHLORIDE 0.9 % IV SOLN: Freq: Once | INTRAVENOUS | Status: AC

## 2022-09-15 MED ORDER — ACETAMINOPHEN 325 MG PO TABS: 650.0000 mg | ORAL_TABLET | Freq: Once | ORAL | Status: DC

## 2022-09-15 MED ORDER — LORATADINE 10 MG PO TABS
10.0000 mg | ORAL_TABLET | Freq: Every day | ORAL | Status: DC
  Administered 2022-09-15: 10 mg via ORAL
  Filled 2022-09-15: qty 1

## 2022-09-15 MED ORDER — ONDANSETRON HCL 4 MG/2ML IJ SOLN
8.0000 mg | Freq: Every day | INTRAMUSCULAR | Status: DC
  Administered 2022-09-15: 8 mg via INTRAVENOUS
  Filled 2022-09-15: qty 4

## 2022-09-15 MED FILL — Cupric Chloride Inj 0.4 MG/ML (Elemental): INTRAVENOUS | Qty: 5 | Status: AC

## 2022-09-15 NOTE — Patient Instructions (Signed)
 Iron Sucrose Injection What is this medication? IRON SUCROSE (EYE ern SOO krose) treats low levels of iron (iron deficiency anemia) in people with kidney disease. Iron is a mineral that plays an important role in making red blood cells, which carry oxygen from your lungs to the rest of your body. This medicine may be used for other purposes; ask your health care provider or pharmacist if you have questions. COMMON BRAND NAME(S): Venofer What should I tell my care team before I take this medication? They need to know if you have any of these conditions: Anemia not caused by low iron levels Heart disease High levels of iron in the blood Kidney disease Liver disease An unusual or allergic reaction to iron, other medications, foods, dyes, or preservatives Pregnant or trying to get pregnant Breastfeeding How should I use this medication? This medication is for infusion into a vein. It is given in a hospital or clinic setting. Talk to your care team about the use of this medication in children. While this medication may be prescribed for children as young as 2 years for selected conditions, precautions do apply. Overdosage: If you think you have taken too much of this medicine contact a poison control center or emergency room at once. NOTE: This medicine is only for you. Do not share this medicine with others. What if I miss a dose? Keep appointments for follow-up doses. It is important not to miss your dose. Call your care team if you are unable to keep an appointment. What may interact with this medication? Do not take this medication with any of the following: Deferoxamine Dimercaprol Other iron products This medication may also interact with the following: Chloramphenicol Deferasirox This list may not describe all possible interactions. Give your health care provider a list of all the medicines, herbs, non-prescription drugs, or dietary supplements you use. Also tell them if you smoke,  drink alcohol, or use illegal drugs. Some items may interact with your medicine. What should I watch for while using this medication? Visit your care team regularly. Tell your care team if your symptoms do not start to get better or if they get worse. You may need blood work done while you are taking this medication. You may need to follow a special diet. Talk to your care team. Foods that contain iron include: whole grains/cereals, dried fruits, beans, or peas, leafy green vegetables, and organ meats (liver, kidney). What side effects may I notice from receiving this medication? Side effects that you should report to your care team as soon as possible: Allergic reactions--skin rash, itching, hives, swelling of the face, lips, tongue, or throat Low blood pressure--dizziness, feeling faint or lightheaded, blurry vision Shortness of breath Side effects that usually do not require medical attention (report to your care team if they continue or are bothersome): Flushing Headache Joint pain Muscle pain Nausea Pain, redness, or irritation at injection site This list may not describe all possible side effects. Call your doctor for medical advice about side effects. You may report side effects to FDA at 1-800-FDA-1088. Where should I keep my medication? This medication is given in a hospital or clinic. It will not be stored at home. NOTE: This sheet is a summary. It may not cover all possible information. If you have questions about this medicine, talk to your doctor, pharmacist, or health care provider.  2024 Elsevier/Gold Standard (2022-06-10 00:00:00)

## 2022-09-16 ENCOUNTER — Inpatient Hospital Stay: Payer: Medicaid Other

## 2022-09-16 VITALS — BP 119/71 | HR 77 | Temp 98.0°F | Resp 18

## 2022-09-16 DIAGNOSIS — D539 Nutritional anemia, unspecified: Secondary | ICD-10-CM | POA: Diagnosis not present

## 2022-09-16 DIAGNOSIS — E610 Copper deficiency: Secondary | ICD-10-CM

## 2022-09-16 MED ORDER — ONDANSETRON HCL 4 MG/2ML IJ SOLN
8.0000 mg | Freq: Every day | INTRAMUSCULAR | Status: DC
  Administered 2022-09-16: 8 mg via INTRAVENOUS
  Filled 2022-09-16: qty 4

## 2022-09-16 MED ORDER — ACETAMINOPHEN 325 MG PO TABS: 650.0000 mg | ORAL_TABLET | Freq: Once | ORAL | Status: DC

## 2022-09-16 MED ORDER — LORATADINE 10 MG PO TABS
10.0000 mg | ORAL_TABLET | Freq: Every day | ORAL | Status: DC
  Administered 2022-09-16: 10 mg via ORAL
  Filled 2022-09-16: qty 1

## 2022-09-16 MED ORDER — SODIUM CHLORIDE 0.9 % IV SOLN
2.0000 mg | Freq: Every day | INTRAVENOUS | Status: DC
  Administered 2022-09-16: 2 mg via INTRAVENOUS
  Filled 2022-09-16: qty 5

## 2022-09-16 MED ORDER — SODIUM CHLORIDE 0.9 % IV SOLN: Freq: Once | INTRAVENOUS | Status: DC

## 2022-09-21 ENCOUNTER — Encounter: Payer: Self-pay | Admitting: Oncology

## 2022-09-21 DIAGNOSIS — F1010 Alcohol abuse, uncomplicated: Secondary | ICD-10-CM | POA: Insufficient documentation

## 2022-09-26 ENCOUNTER — Encounter: Payer: Self-pay | Admitting: Hematology and Oncology

## 2022-10-03 ENCOUNTER — Telehealth: Payer: Self-pay

## 2022-10-03 NOTE — Telephone Encounter (Signed)
Spoke with patient he voiced he has an appt on Oct 17th with [facility] dermatology.

## 2022-11-04 ENCOUNTER — Inpatient Hospital Stay: Payer: Medicaid Other | Attending: Hematology and Oncology | Admitting: Oncology

## 2022-11-04 ENCOUNTER — Inpatient Hospital Stay: Payer: Medicaid Other

## 2022-11-04 VITALS — BP 178/92 | HR 52 | Temp 98.9°F | Resp 16 | Ht 71.5 in | Wt 149.4 lb

## 2022-11-04 DIAGNOSIS — F32A Depression, unspecified: Secondary | ICD-10-CM | POA: Diagnosis not present

## 2022-11-04 DIAGNOSIS — R002 Palpitations: Secondary | ICD-10-CM | POA: Diagnosis not present

## 2022-11-04 DIAGNOSIS — R197 Diarrhea, unspecified: Secondary | ICD-10-CM | POA: Diagnosis not present

## 2022-11-04 DIAGNOSIS — Z8249 Family history of ischemic heart disease and other diseases of the circulatory system: Secondary | ICD-10-CM | POA: Insufficient documentation

## 2022-11-04 DIAGNOSIS — I10 Essential (primary) hypertension: Secondary | ICD-10-CM | POA: Insufficient documentation

## 2022-11-04 DIAGNOSIS — G629 Polyneuropathy, unspecified: Secondary | ICD-10-CM | POA: Diagnosis not present

## 2022-11-04 DIAGNOSIS — F17210 Nicotine dependence, cigarettes, uncomplicated: Secondary | ICD-10-CM | POA: Insufficient documentation

## 2022-11-04 DIAGNOSIS — Z833 Family history of diabetes mellitus: Secondary | ICD-10-CM | POA: Insufficient documentation

## 2022-11-04 DIAGNOSIS — Z8041 Family history of malignant neoplasm of ovary: Secondary | ICD-10-CM | POA: Insufficient documentation

## 2022-11-04 DIAGNOSIS — M791 Myalgia, unspecified site: Secondary | ICD-10-CM | POA: Diagnosis not present

## 2022-11-04 DIAGNOSIS — E559 Vitamin D deficiency, unspecified: Secondary | ICD-10-CM | POA: Diagnosis not present

## 2022-11-04 DIAGNOSIS — D539 Nutritional anemia, unspecified: Secondary | ICD-10-CM | POA: Diagnosis present

## 2022-11-04 DIAGNOSIS — R350 Frequency of micturition: Secondary | ICD-10-CM | POA: Insufficient documentation

## 2022-11-04 DIAGNOSIS — K760 Fatty (change of) liver, not elsewhere classified: Secondary | ICD-10-CM | POA: Diagnosis not present

## 2022-11-04 DIAGNOSIS — N6459 Other signs and symptoms in breast: Secondary | ICD-10-CM | POA: Insufficient documentation

## 2022-11-04 DIAGNOSIS — Z8261 Family history of arthritis: Secondary | ICD-10-CM | POA: Insufficient documentation

## 2022-11-04 DIAGNOSIS — F419 Anxiety disorder, unspecified: Secondary | ICD-10-CM | POA: Diagnosis not present

## 2022-11-04 DIAGNOSIS — R200 Anesthesia of skin: Secondary | ICD-10-CM | POA: Insufficient documentation

## 2022-11-04 DIAGNOSIS — E610 Copper deficiency: Secondary | ICD-10-CM

## 2022-11-04 DIAGNOSIS — R509 Fever, unspecified: Secondary | ICD-10-CM | POA: Insufficient documentation

## 2022-11-04 DIAGNOSIS — Z808 Family history of malignant neoplasm of other organs or systems: Secondary | ICD-10-CM | POA: Insufficient documentation

## 2022-11-04 DIAGNOSIS — M2550 Pain in unspecified joint: Secondary | ICD-10-CM | POA: Diagnosis not present

## 2022-11-04 DIAGNOSIS — E785 Hyperlipidemia, unspecified: Secondary | ICD-10-CM | POA: Diagnosis not present

## 2022-11-04 DIAGNOSIS — R5383 Other fatigue: Secondary | ICD-10-CM | POA: Diagnosis not present

## 2022-11-04 DIAGNOSIS — F1010 Alcohol abuse, uncomplicated: Secondary | ICD-10-CM | POA: Insufficient documentation

## 2022-11-04 DIAGNOSIS — Z8349 Family history of other endocrine, nutritional and metabolic diseases: Secondary | ICD-10-CM | POA: Insufficient documentation

## 2022-11-04 DIAGNOSIS — D696 Thrombocytopenia, unspecified: Secondary | ICD-10-CM | POA: Insufficient documentation

## 2022-11-04 DIAGNOSIS — Z841 Family history of disorders of kidney and ureter: Secondary | ICD-10-CM | POA: Insufficient documentation

## 2022-11-04 DIAGNOSIS — Z79899 Other long term (current) drug therapy: Secondary | ICD-10-CM | POA: Diagnosis not present

## 2022-11-04 DIAGNOSIS — Z8042 Family history of malignant neoplasm of prostate: Secondary | ICD-10-CM | POA: Insufficient documentation

## 2022-11-04 DIAGNOSIS — Z83438 Family history of other disorder of lipoprotein metabolism and other lipidemia: Secondary | ICD-10-CM | POA: Insufficient documentation

## 2022-11-04 DIAGNOSIS — Z7289 Other problems related to lifestyle: Secondary | ICD-10-CM | POA: Insufficient documentation

## 2022-11-04 DIAGNOSIS — Z800 Family history of malignant neoplasm of digestive organs: Secondary | ICD-10-CM | POA: Insufficient documentation

## 2022-11-04 DIAGNOSIS — R634 Abnormal weight loss: Secondary | ICD-10-CM | POA: Diagnosis not present

## 2022-11-04 DIAGNOSIS — R059 Cough, unspecified: Secondary | ICD-10-CM | POA: Insufficient documentation

## 2022-11-04 DIAGNOSIS — Z803 Family history of malignant neoplasm of breast: Secondary | ICD-10-CM | POA: Insufficient documentation

## 2022-11-04 DIAGNOSIS — M1990 Unspecified osteoarthritis, unspecified site: Secondary | ICD-10-CM | POA: Insufficient documentation

## 2022-11-04 LAB — CBC WITH DIFFERENTIAL/PLATELET
Abs Immature Granulocytes: 0.03 10*3/uL (ref 0.00–0.07)
Basophils Absolute: 0 10*3/uL (ref 0.0–0.1)
Basophils Relative: 0 %
Eosinophils Absolute: 0 10*3/uL (ref 0.0–0.5)
Eosinophils Relative: 0 %
HCT: 40.4 % (ref 39.0–52.0)
Hemoglobin: 13.1 g/dL (ref 13.0–17.0)
Immature Granulocytes: 0 %
Lymphocytes Relative: 33 %
Lymphs Abs: 2.6 10*3/uL (ref 0.7–4.0)
MCH: 31.4 pg (ref 26.0–34.0)
MCHC: 32.4 g/dL (ref 30.0–36.0)
MCV: 96.9 fL (ref 80.0–100.0)
Monocytes Absolute: 0.7 10*3/uL (ref 0.1–1.0)
Monocytes Relative: 9 %
Neutro Abs: 4.5 10*3/uL (ref 1.7–7.7)
Neutrophils Relative %: 58 %
Platelets: 101 10*3/uL — ABNORMAL LOW (ref 150–400)
RBC: 4.17 MIL/uL — ABNORMAL LOW (ref 4.22–5.81)
RDW: 14.7 % (ref 11.5–15.5)
WBC: 7.8 10*3/uL (ref 4.0–10.5)
nRBC: 0 % (ref 0.0–0.2)

## 2022-11-04 LAB — VITAMIN B12: Vitamin B-12: 301 pg/mL (ref 180–914)

## 2022-11-04 LAB — FERRITIN: Ferritin: 31 ng/mL (ref 24–336)
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
## 2022-11-04 NOTE — Patient Instructions (Signed)
Please begin a copper supplement 2 mg daily and a B complex vitamin 1 table daily These can be ordered from [employer]

## 2022-11-09 LAB — ZINC: Zinc: 46 ug/dL (ref 44–115)

## 2022-11-09 LAB — COPPER, SERUM: Copper: 63 ug/dL — ABNORMAL LOW (ref 69–132)

## 2022-11-15 ENCOUNTER — Encounter: Payer: Self-pay | Admitting: Oncology

## 2022-11-17 ENCOUNTER — Telehealth: Payer: Self-pay

## 2022-11-17 NOTE — Telephone Encounter (Signed)
-----   Message from Loni Muse sent at 11/17/2022  8:36 AM EDT ----- Regarding: RE: copper His copper level remains low and this is the best way to increase it.  Would appreciate it if someone can call and explain Thanks ----- Message ----- From: Thomasena Edis Sent: 11/16/2022   4:07 PM EDT To: Domenic Schwab, RPH; Arville Care, CMA; # Subject: copper                                         Spoke with patient to schedule more copper infusions.Patient became very upset.He stated he was told to order copper and zinc supplements.Please call and inform why more copper is needed.Thanks

## 2022-11-17 NOTE — Telephone Encounter (Signed)
Called and spoke with patient and advised of copper level still being low causing the need for copper infusion. I advised him that we would still end up needing the supplements.

## 2022-11-28 ENCOUNTER — Inpatient Hospital Stay: Payer: Medicaid Other

## 2022-11-28 ENCOUNTER — Encounter: Payer: Self-pay | Admitting: Oncology

## 2022-11-29 ENCOUNTER — Inpatient Hospital Stay: Payer: Medicaid Other | Attending: Hematology and Oncology

## 2022-11-29 VITALS — BP 146/94 | HR 55 | Temp 98.2°F | Resp 18 | Ht 71.5 in | Wt 149.0 lb

## 2022-11-29 DIAGNOSIS — D539 Nutritional anemia, unspecified: Secondary | ICD-10-CM | POA: Diagnosis present

## 2022-11-29 DIAGNOSIS — Z79899 Other long term (current) drug therapy: Secondary | ICD-10-CM | POA: Insufficient documentation

## 2022-11-29 DIAGNOSIS — D696 Thrombocytopenia, unspecified: Secondary | ICD-10-CM | POA: Insufficient documentation

## 2022-11-29 DIAGNOSIS — E610 Copper deficiency: Secondary | ICD-10-CM

## 2022-11-29 MED ORDER — ACETAMINOPHEN 325 MG PO TABS
650.0000 mg | ORAL_TABLET | Freq: Once | ORAL | Status: DC
  Filled 2022-11-29: qty 2

## 2022-11-29 MED ORDER — SODIUM CHLORIDE 0.9 % IV SOLN
2.0000 mg | Freq: Every day | INTRAVENOUS | Status: DC
  Administered 2022-11-29: 2 mg via INTRAVENOUS
  Filled 2022-11-29: qty 5

## 2022-11-29 MED ORDER — ONDANSETRON HCL 4 MG/2ML IJ SOLN
8.0000 mg | Freq: Every day | INTRAMUSCULAR | Status: DC
  Administered 2022-11-29: 8 mg via INTRAVENOUS
  Filled 2022-11-29: qty 4

## 2022-11-29 MED ORDER — SODIUM CHLORIDE 0.9 % IV SOLN: INTRAVENOUS | Status: DC | PRN

## 2022-11-29 MED ORDER — LORATADINE 10 MG PO TABS
10.0000 mg | ORAL_TABLET | Freq: Every day | ORAL | Status: DC
  Administered 2022-11-29: 10 mg via ORAL
  Filled 2022-11-29: qty 1

## 2022-11-29 MED ORDER — SODIUM CHLORIDE 0.9% FLUSH: 10.0000 mL | Freq: Two times a day (BID) | INTRAVENOUS | Status: DC

## 2022-11-29 NOTE — Patient Instructions (Signed)
Copper Injection What is this medication? COPPER (KOP er) prevents and treats low levels of copper in your body. Copper is a mineral that plays an important role in forming red blood cells. It also helps maintain nervous and immune system health. This medicine may be used for other purposes; ask your health care provider or pharmacist if you have questions. What should I tell my care team before I take this medication? They need to know if you have any of these conditions: Kidney disease Liver disease Low levels of zinc in the blood Wilson disease An unusual or allergic reaction to copper, other medications, foods, dyes, or preservatives Pregnant or trying to get pregnant Breastfeeding How should I use this medication? This medication is infused into a vein. It is given by your care team in a hospital or clinic setting. Talk to your care team about the use of this medication in children. While it may be prescribed to children for selected conditions, precautions do apply. Overdosage: If you think you have taken too much of this medicine contact a poison control center or emergency room at once. NOTE: This medicine is only for you. Do not share this medicine with others. What if I miss a dose? This does not apply. What may interact with this medication? Interactions are not expected. This list may not describe all possible interactions. Give your health care provider a list of all the medicines, herbs, non-prescription drugs, or dietary supplements you use. Also tell them if you smoke, drink alcohol, or use illegal drugs. Some items may interact with your medicine. What should I watch for while using this medication? Your condition will be monitored carefully while you are receiving this medication. You may need blood work done while you are taking this medication. What side effects may I notice from receiving this medication? Side effects that you should report to your care team as soon as  possible: Allergic reactions--skin rash, itching, hives, swelling of the face, lips, tongue, or throat This list may not describe all possible side effects. Call your doctor for medical advice about side effects. You may report side effects to FDA at 1-800-FDA-1088. Where should I keep my medication? This medication is given in a hospital or clinic. It will not be stored at home. NOTE: This sheet is a summary. It may not cover all possible information. If you have questions about this medicine, talk to your doctor, pharmacist, or health care provider.  2024 Elsevier/Gold Standard (2022-05-04 00:00:00)

## 2022-11-30 ENCOUNTER — Inpatient Hospital Stay: Payer: Medicaid Other

## 2022-11-30 VITALS — BP 137/81 | HR 55 | Temp 98.4°F | Resp 18 | Ht 71.5 in | Wt 149.0 lb

## 2022-11-30 DIAGNOSIS — D696 Thrombocytopenia, unspecified: Secondary | ICD-10-CM | POA: Diagnosis not present

## 2022-11-30 DIAGNOSIS — E610 Copper deficiency: Secondary | ICD-10-CM

## 2022-11-30 MED ORDER — SODIUM CHLORIDE 0.9% FLUSH: 10.0000 mL | Freq: Two times a day (BID) | INTRAVENOUS | Status: DC

## 2022-11-30 MED ORDER — ONDANSETRON HCL 4 MG/2ML IJ SOLN
8.0000 mg | Freq: Every day | INTRAMUSCULAR | Status: DC
  Administered 2022-11-30: 8 mg via INTRAVENOUS
  Filled 2022-11-30: qty 4

## 2022-11-30 MED ORDER — SODIUM CHLORIDE 0.9 % IV SOLN
2.0000 mg | Freq: Every day | INTRAVENOUS | Status: DC
  Administered 2022-11-30: 2 mg via INTRAVENOUS
  Filled 2022-11-30: qty 5

## 2022-11-30 MED ORDER — ACETAMINOPHEN 325 MG PO TABS
650.0000 mg | ORAL_TABLET | Freq: Once | ORAL | Status: DC
Start: 2022-11-30 — End: 2022-11-30
  Filled 2022-11-30: qty 2

## 2022-11-30 MED ORDER — LORATADINE 10 MG PO TABS
10.0000 mg | ORAL_TABLET | Freq: Every day | ORAL | Status: DC
  Administered 2022-11-30: 10 mg via ORAL
  Filled 2022-11-30: qty 1

## 2022-11-30 NOTE — Patient Instructions (Signed)
Copper Injection What is this medication? COPPER (KOP er) prevents and treats low levels of copper in your body. Copper is a mineral that plays an important role in forming red blood cells. It also helps maintain nervous and immune system health. This medicine may be used for other purposes; ask your health care provider or pharmacist if you have questions. What should I tell my care team before I take this medication? They need to know if you have any of these conditions: Kidney disease Liver disease Low levels of zinc in the blood Wilson disease An unusual or allergic reaction to copper, other medications, foods, dyes, or preservatives Pregnant or trying to get pregnant Breastfeeding How should I use this medication? This medication is infused into a vein. It is given by your care team in a hospital or clinic setting. Talk to your care team about the use of this medication in children. While it may be prescribed to children for selected conditions, precautions do apply. Overdosage: If you think you have taken too much of this medicine contact a poison control center or emergency room at once. NOTE: This medicine is only for you. Do not share this medicine with others. What if I miss a dose? This does not apply. What may interact with this medication? Interactions are not expected. This list may not describe all possible interactions. Give your health care provider a list of all the medicines, herbs, non-prescription drugs, or dietary supplements you use. Also tell them if you smoke, drink alcohol, or use illegal drugs. Some items may interact with your medicine. What should I watch for while using this medication? Your condition will be monitored carefully while you are receiving this medication. You may need blood work done while you are taking this medication. What side effects may I notice from receiving this medication? Side effects that you should report to your care team as soon as  possible: Allergic reactions--skin rash, itching, hives, swelling of the face, lips, tongue, or throat This list may not describe all possible side effects. Call your doctor for medical advice about side effects. You may report side effects to FDA at 1-800-FDA-1088. Where should I keep my medication? This medication is given in a hospital or clinic. It will not be stored at home. NOTE: This sheet is a summary. It may not cover all possible information. If you have questions about this medicine, talk to your doctor, pharmacist, or health care provider.  2024 Elsevier/Gold Standard (2022-05-04 00:00:00)

## 2022-12-01 ENCOUNTER — Inpatient Hospital Stay: Payer: Medicaid Other

## 2022-12-01 VITALS — BP 146/85 | HR 56 | Temp 98.1°F | Resp 18 | Ht 71.5 in | Wt 149.0 lb

## 2022-12-01 DIAGNOSIS — D696 Thrombocytopenia, unspecified: Secondary | ICD-10-CM | POA: Diagnosis not present

## 2022-12-01 DIAGNOSIS — E610 Copper deficiency: Secondary | ICD-10-CM

## 2022-12-01 MED ORDER — ACETAMINOPHEN 325 MG PO TABS
650.0000 mg | ORAL_TABLET | Freq: Once | ORAL | Status: DC
  Filled 2022-12-01: qty 2

## 2022-12-01 MED ORDER — ONDANSETRON HCL 4 MG/2ML IJ SOLN
8.0000 mg | Freq: Every day | INTRAMUSCULAR | Status: DC
  Administered 2022-12-01: 8 mg via INTRAVENOUS
  Filled 2022-12-01: qty 4

## 2022-12-01 MED ORDER — SODIUM CHLORIDE 0.9 % IV SOLN
2.0000 mg | Freq: Every day | INTRAVENOUS | Status: DC
  Administered 2022-12-01: 2 mg via INTRAVENOUS
  Filled 2022-12-01: qty 5

## 2022-12-01 MED ORDER — SODIUM CHLORIDE 0.9 % IV SOLN: INTRAVENOUS | Status: DC | PRN

## 2022-12-01 MED ORDER — LORATADINE 10 MG PO TABS
10.0000 mg | ORAL_TABLET | Freq: Every day | ORAL | Status: DC
  Administered 2022-12-01: 10 mg via ORAL
  Filled 2022-12-01: qty 1

## 2022-12-01 NOTE — Patient Instructions (Signed)
Copper Injection What is this medication? COPPER (KOP er) prevents and treats low levels of copper in your body. Copper is a mineral that plays an important role in forming red blood cells. It also helps maintain nervous and immune system health. This medicine may be used for other purposes; ask your health care provider or pharmacist if you have questions. What should I tell my care team before I take this medication? They need to know if you have any of these conditions: Kidney disease Liver disease Low levels of zinc in the blood Wilson disease An unusual or allergic reaction to copper, other medications, foods, dyes, or preservatives Pregnant or trying to get pregnant Breastfeeding How should I use this medication? This medication is infused into a vein. It is given by your care team in a hospital or clinic setting. Talk to your care team about the use of this medication in children. While it may be prescribed to children for selected conditions, precautions do apply. Overdosage: If you think you have taken too much of this medicine contact a poison control center or emergency room at once. NOTE: This medicine is only for you. Do not share this medicine with others. What if I miss a dose? This does not apply. What may interact with this medication? Interactions are not expected. This list may not describe all possible interactions. Give your health care provider a list of all the medicines, herbs, non-prescription drugs, or dietary supplements you use. Also tell them if you smoke, drink alcohol, or use illegal drugs. Some items may interact with your medicine. What should I watch for while using this medication? Your condition will be monitored carefully while you are receiving this medication. You may need blood work done while you are taking this medication. What side effects may I notice from receiving this medication? Side effects that you should report to your care team as soon as  possible: Allergic reactions--skin rash, itching, hives, swelling of the face, lips, tongue, or throat This list may not describe all possible side effects. Call your doctor for medical advice about side effects. You may report side effects to FDA at 1-800-FDA-1088. Where should I keep my medication? This medication is given in a hospital or clinic. It will not be stored at home. NOTE: This sheet is a summary. It may not cover all possible information. If you have questions about this medicine, talk to your doctor, pharmacist, or health care provider.  2024 Elsevier/Gold Standard (2022-05-04 00:00:00)

## 2022-12-02 ENCOUNTER — Inpatient Hospital Stay: Payer: Medicaid Other

## 2022-12-02 VITALS — BP 155/90 | HR 60 | Temp 98.4°F | Resp 18

## 2022-12-02 DIAGNOSIS — E610 Copper deficiency: Secondary | ICD-10-CM

## 2022-12-02 DIAGNOSIS — D696 Thrombocytopenia, unspecified: Secondary | ICD-10-CM | POA: Diagnosis not present

## 2022-12-02 MED ORDER — LORATADINE 10 MG PO TABS
10.0000 mg | ORAL_TABLET | Freq: Once | ORAL | Status: AC
  Administered 2022-12-02: 10 mg via ORAL
  Filled 2022-12-02: qty 1

## 2022-12-02 MED ORDER — ONDANSETRON HCL 4 MG/2ML IJ SOLN
8.0000 mg | Freq: Once | INTRAMUSCULAR | Status: AC
Start: 2022-12-02 — End: 2022-12-02
  Administered 2022-12-02: 8 mg via INTRAVENOUS
  Filled 2022-12-02 (×2): qty 4

## 2022-12-02 MED ORDER — SODIUM CHLORIDE 0.9 % IV SOLN: INTRAVENOUS | Status: DC | PRN

## 2022-12-02 MED ORDER — SODIUM CHLORIDE 0.9 % IV SOLN
2.0000 mg | Freq: Every day | INTRAVENOUS | Status: DC
  Administered 2022-12-02: 2 mg via INTRAVENOUS
  Filled 2022-12-02: qty 5

## 2022-12-02 MED ORDER — SODIUM CHLORIDE 0.9% FLUSH: 10.0000 mL | Freq: Two times a day (BID) | INTRAVENOUS | Status: DC

## 2022-12-02 NOTE — Patient Instructions (Signed)
 Copper Injection What is this medication? COPPER (KOP er) prevents and treats low levels of copper in your body. Copper is a mineral that plays an important role in forming red blood cells. It also helps maintain nervous and immune system health. This medicine may be used for other purposes; ask your health care provider or pharmacist if you have questions. What should I tell my care team before I take this medication? They need to know if you have any of these conditions: Kidney disease Liver disease Low levels of zinc in the blood Wilson disease An unusual or allergic reaction to copper, other medications, foods, dyes, or preservatives Pregnant or trying to get pregnant Breastfeeding How should I use this medication? This medication is infused into a vein. It is given by your care team in a hospital or clinic setting. Talk to your care team about the use of this medication in children. While it may be prescribed to children for selected conditions, precautions do apply. Overdosage: If you think you have taken too much of this medicine contact a poison control center or emergency room at once. NOTE: This medicine is only for you. Do not share this medicine with others. What if I miss a dose? This does not apply. What may interact with this medication? Interactions are not expected. This list may not describe all possible interactions. Give your health care provider a list of all the medicines, herbs, non-prescription drugs, or dietary supplements you use. Also tell them if you smoke, drink alcohol, or use illegal drugs. Some items may interact with your medicine. What should I watch for while using this medication? Your condition will be monitored carefully while you are receiving this medication. You may need blood work done while you are taking this medication. What side effects may I notice from receiving this medication? Side effects that you should report to your care team as soon as  possible: Allergic reactions--skin rash, itching, hives, swelling of the face, lips, tongue, or throat This list may not describe all possible side effects. Call your doctor for medical advice about side effects. You may report side effects to FDA at 1-800-FDA-1088. Where should I keep my medication? This medication is given in a hospital or clinic. It will not be stored at home. NOTE: This sheet is a summary. It may not cover all possible information. If you have questions about this medicine, talk to your doctor, pharmacist, or health care provider.  2024 Elsevier/Gold Standard (2022-05-04 00:00:00)

## 2022-12-05 ENCOUNTER — Encounter: Payer: Self-pay | Admitting: Oncology

## 2022-12-05 ENCOUNTER — Inpatient Hospital Stay: Payer: Medicaid Other

## 2022-12-05 VITALS — BP 150/87 | HR 55 | Temp 98.5°F | Resp 20 | Ht 71.5 in | Wt 153.0 lb

## 2022-12-05 DIAGNOSIS — E610 Copper deficiency: Secondary | ICD-10-CM

## 2022-12-05 DIAGNOSIS — D696 Thrombocytopenia, unspecified: Secondary | ICD-10-CM | POA: Diagnosis not present

## 2022-12-05 MED ORDER — LORATADINE 10 MG PO TABS
10.0000 mg | ORAL_TABLET | Freq: Every day | ORAL | Status: DC
  Administered 2022-12-05: 10 mg via ORAL
  Filled 2022-12-05: qty 1

## 2022-12-05 MED ORDER — SODIUM CHLORIDE 0.9 % IV SOLN
2.0000 mg | Freq: Every day | INTRAVENOUS | Status: DC
  Administered 2022-12-05: 2 mg via INTRAVENOUS
  Filled 2022-12-05: qty 5

## 2022-12-05 MED ORDER — SODIUM CHLORIDE 0.9% FLUSH: 10.0000 mL | Freq: Two times a day (BID) | INTRAVENOUS | Status: DC

## 2022-12-05 MED ORDER — ONDANSETRON HCL 8 MG PO TABS
8.0000 mg | ORAL_TABLET | Freq: Once | ORAL | Status: AC
  Administered 2022-12-05: 8 mg via ORAL
  Filled 2022-12-05: qty 1

## 2022-12-05 MED ORDER — SODIUM CHLORIDE 0.9 % IV SOLN: INTRAVENOUS | Status: DC | PRN

## 2022-12-05 NOTE — Patient Instructions (Signed)
 Copper Injection What is this medication? COPPER (KOP er) prevents and treats low levels of copper in your body. Copper is a mineral that plays an important role in forming red blood cells. It also helps maintain nervous and immune system health. This medicine may be used for other purposes; ask your health care provider or pharmacist if you have questions. What should I tell my care team before I take this medication? They need to know if you have any of these conditions: Kidney disease Liver disease Low levels of zinc in the blood Wilson disease An unusual or allergic reaction to copper, other medications, foods, dyes, or preservatives Pregnant or trying to get pregnant Breastfeeding How should I use this medication? This medication is infused into a vein. It is given by your care team in a hospital or clinic setting. Talk to your care team about the use of this medication in children. While it may be prescribed to children for selected conditions, precautions do apply. Overdosage: If you think you have taken too much of this medicine contact a poison control center or emergency room at once. NOTE: This medicine is only for you. Do not share this medicine with others. What if I miss a dose? This does not apply. What may interact with this medication? Interactions are not expected. This list may not describe all possible interactions. Give your health care provider a list of all the medicines, herbs, non-prescription drugs, or dietary supplements you use. Also tell them if you smoke, drink alcohol, or use illegal drugs. Some items may interact with your medicine. What should I watch for while using this medication? Your condition will be monitored carefully while you are receiving this medication. You may need blood work done while you are taking this medication. What side effects may I notice from receiving this medication? Side effects that you should report to your care team as soon as  possible: Allergic reactions--skin rash, itching, hives, swelling of the face, lips, tongue, or throat This list may not describe all possible side effects. Call your doctor for medical advice about side effects. You may report side effects to FDA at 1-800-FDA-1088. Where should I keep my medication? This medication is given in a hospital or clinic. It will not be stored at home. NOTE: This sheet is a summary. It may not cover all possible information. If you have questions about this medicine, talk to your doctor, pharmacist, or health care provider.  2024 Elsevier/Gold Standard (2022-05-04 00:00:00)

## 2023-01-16 ENCOUNTER — Telehealth: Payer: Self-pay | Admitting: Oncology

## 2023-01-16 NOTE — Telephone Encounter (Signed)
Patient has been scheduled. Aware of appt date and time.    Message Received: 1 week ago Arville Care, CMA  [facility] Scheduling Patient needs follow up with labs since completing his copper infusions

## 2023-01-20 ENCOUNTER — Inpatient Hospital Stay: Payer: Medicaid Other | Attending: Hematology and Oncology | Admitting: Oncology

## 2023-01-20 ENCOUNTER — Inpatient Hospital Stay: Payer: Medicaid Other

## 2023-01-20 ENCOUNTER — Telehealth: Payer: Self-pay | Admitting: Oncology

## 2023-01-20 VITALS — BP 152/84 | HR 55 | Temp 98.5°F | Resp 16 | Ht 71.5 in | Wt 159.6 lb

## 2023-01-20 DIAGNOSIS — Z79899 Other long term (current) drug therapy: Secondary | ICD-10-CM | POA: Insufficient documentation

## 2023-01-20 DIAGNOSIS — I10 Essential (primary) hypertension: Secondary | ICD-10-CM | POA: Diagnosis not present

## 2023-01-20 DIAGNOSIS — E7800 Pure hypercholesterolemia, unspecified: Secondary | ICD-10-CM | POA: Diagnosis not present

## 2023-01-20 DIAGNOSIS — Z5982 Transportation insecurity: Secondary | ICD-10-CM | POA: Diagnosis not present

## 2023-01-20 DIAGNOSIS — K760 Fatty (change of) liver, not elsewhere classified: Secondary | ICD-10-CM | POA: Insufficient documentation

## 2023-01-20 DIAGNOSIS — Z83438 Family history of other disorder of lipoprotein metabolism and other lipidemia: Secondary | ICD-10-CM | POA: Diagnosis not present

## 2023-01-20 DIAGNOSIS — Z8042 Family history of malignant neoplasm of prostate: Secondary | ICD-10-CM | POA: Insufficient documentation

## 2023-01-20 DIAGNOSIS — E610 Copper deficiency: Secondary | ICD-10-CM | POA: Insufficient documentation

## 2023-01-20 DIAGNOSIS — G629 Polyneuropathy, unspecified: Secondary | ICD-10-CM | POA: Insufficient documentation

## 2023-01-20 DIAGNOSIS — E559 Vitamin D deficiency, unspecified: Secondary | ICD-10-CM | POA: Insufficient documentation

## 2023-01-20 DIAGNOSIS — Z8249 Family history of ischemic heart disease and other diseases of the circulatory system: Secondary | ICD-10-CM | POA: Diagnosis not present

## 2023-01-20 DIAGNOSIS — Z8041 Family history of malignant neoplasm of ovary: Secondary | ICD-10-CM | POA: Diagnosis not present

## 2023-01-20 DIAGNOSIS — F419 Anxiety disorder, unspecified: Secondary | ICD-10-CM | POA: Insufficient documentation

## 2023-01-20 DIAGNOSIS — Z833 Family history of diabetes mellitus: Secondary | ICD-10-CM | POA: Diagnosis not present

## 2023-01-20 DIAGNOSIS — D696 Thrombocytopenia, unspecified: Secondary | ICD-10-CM | POA: Insufficient documentation

## 2023-01-20 DIAGNOSIS — D539 Nutritional anemia, unspecified: Secondary | ICD-10-CM | POA: Diagnosis present

## 2023-01-20 DIAGNOSIS — F1010 Alcohol abuse, uncomplicated: Secondary | ICD-10-CM | POA: Insufficient documentation

## 2023-01-20 DIAGNOSIS — Z8261 Family history of arthritis: Secondary | ICD-10-CM | POA: Diagnosis not present

## 2023-01-20 DIAGNOSIS — Z808 Family history of malignant neoplasm of other organs or systems: Secondary | ICD-10-CM | POA: Insufficient documentation

## 2023-01-20 DIAGNOSIS — F32A Depression, unspecified: Secondary | ICD-10-CM | POA: Diagnosis not present

## 2023-01-20 DIAGNOSIS — F17210 Nicotine dependence, cigarettes, uncomplicated: Secondary | ICD-10-CM | POA: Insufficient documentation

## 2023-01-20 DIAGNOSIS — E785 Hyperlipidemia, unspecified: Secondary | ICD-10-CM | POA: Insufficient documentation

## 2023-01-20 DIAGNOSIS — Z803 Family history of malignant neoplasm of breast: Secondary | ICD-10-CM | POA: Insufficient documentation

## 2023-01-20 DIAGNOSIS — M1990 Unspecified osteoarthritis, unspecified site: Secondary | ICD-10-CM | POA: Insufficient documentation

## 2023-01-20 DIAGNOSIS — Z809 Family history of malignant neoplasm, unspecified: Secondary | ICD-10-CM | POA: Insufficient documentation

## 2023-01-20 LAB — CBC WITH DIFFERENTIAL/PLATELET
Abs Immature Granulocytes: 0.02 10*3/uL (ref 0.00–0.07)
Basophils Absolute: 0 10*3/uL (ref 0.0–0.1)
Basophils Relative: 0 %
Eosinophils Absolute: 0.1 10*3/uL (ref 0.0–0.5)
Eosinophils Relative: 1 %
HCT: 35.6 % — ABNORMAL LOW (ref 39.0–52.0)
Hemoglobin: 12.4 g/dL — ABNORMAL LOW (ref 13.0–17.0)
Immature Granulocytes: 0 %
Immature Platelet Fraction: 2.9 % (ref 1.2–8.6)
Lymphocytes Relative: 47 %
Lymphs Abs: 2.9 10*3/uL (ref 0.7–4.0)
MCH: 33 pg (ref 26.0–34.0)
MCHC: 34.8 g/dL (ref 30.0–36.0)
MCV: 94.7 fL (ref 80.0–100.0)
Monocytes Absolute: 0.5 10*3/uL (ref 0.1–1.0)
Monocytes Relative: 8 %
Neutro Abs: 2.8 10*3/uL (ref 1.7–7.7)
Neutrophils Relative %: 44 %
Platelets: 106 10*3/uL — ABNORMAL LOW (ref 150–400)
RBC: 3.76 MIL/uL — ABNORMAL LOW (ref 4.22–5.81)
RDW: 17.5 % — ABNORMAL HIGH (ref 11.5–15.5)
WBC: 6.3 10*3/uL (ref 4.0–10.5)
nRBC: 0 % (ref 0.0–0.2)
nRBC: 0 /100{WBCs}

## 2023-01-20 LAB — FOLATE: Folate: 6.6 ng/mL (ref >5.9)

## 2023-01-20 LAB — VITAMIN B12: Vitamin B-12: 590 pg/mL (ref 180–914)

## 2023-01-20 LAB — FERRITIN: Ferritin: 24 ng/mL (ref 24–336)

## 2023-01-20 NOTE — Telephone Encounter (Signed)
 01/20/23 Next appt scheduled and confirmed with patient
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
## 2023-01-23 LAB — COPPER, SERUM: Copper: 94 ug/dL (ref 69–132)

## 2023-05-15 ENCOUNTER — Telehealth: Payer: Self-pay | Admitting: Oncology

## 2023-05-15 NOTE — Telephone Encounter (Signed)
 05/15/23 Spoke with patient and sent request for transportation to [place]

## 2023-05-21 ENCOUNTER — Other Ambulatory Visit: Payer: Self-pay | Admitting: Oncology

## 2023-05-21 DIAGNOSIS — D696 Thrombocytopenia, unspecified: Secondary | ICD-10-CM

## 2023-05-21 NOTE — Progress Notes (Unsigned)
 [facility]  [address] [place],  [place]  [address] [phone]  Clinic Day:  05/22/2023  Referring physician: Freida Jes, FNP   HISTORY OF PRESENT ILLNESS:  The patient is a 53 y.o. male with thrombocytopenia that has been multifactorial, including being due to hepatic steatosis and copper  deficiency.  He received IV copper  in November 2024.  He comes in today to reassess his platelets.  Since his last visit, the patient has been doing okay.  He denies having any subcutaneous bleeding/bruising issues which concern him for severe thrombocytopenia is being present.  In his chart, there is a history of alcohol abuse.  When inquiring about the amount of alcohol he consumes, the patient claims to have been drinking for 20-30+ years.  He claims he drank three 40-bottles of liquor on both Saturday and Sunday of this past weekend.  This gentleman also complains of dysuria/hematuria.  He also claims that the tip of his penis has been red and painful.  He denies having any recent sexual intercourse.  PHYSICAL EXAM:  Blood pressure (!) 147/80, pulse 65, temperature 99.4 F (37.4 C), temperature source Oral, resp. rate 16, height 5\' 11"  (1.803 m), weight 156 lb 12.8 oz (71.1 kg), SpO2 99%. Wt Readings from Last 3 Encounters:  05/22/23 156 lb 12.8 oz (71.1 kg)  01/20/23 159 lb 9.6 oz (72.4 kg)  12/05/22 153 lb (69.4 kg)   Body mass index is 21.87 kg/m. Performance status (ECOG): 1 - Symptomatic but completely ambulatory Physical Exam Constitutional:      Appearance: Normal appearance. He is not ill-appearing.  HENT:     Mouth/Throat:     Mouth: Mucous membranes are moist.     Pharynx: Oropharynx is clear. No oropharyngeal exudate or posterior oropharyngeal erythema.  Cardiovascular:     Rate and Rhythm: Normal rate and regular rhythm.     Heart sounds: No murmur heard.    No friction rub. No gallop.  Pulmonary:     Effort: Pulmonary effort is  normal. No respiratory distress.     Breath sounds: Normal breath sounds. No wheezing, rhonchi or rales.  Abdominal:     General: Bowel sounds are normal. There is no distension.     Palpations: Abdomen is soft. There is no mass.     Tenderness: There is no abdominal tenderness.  Genitourinary:    Comments: The head of his penis is red, with some areas of white discoloration.  However, no vesicular or ulcer-type lesions are seen. Musculoskeletal:        General: No swelling.     Right lower leg: No edema.     Left lower leg: No edema.  Lymphadenopathy:     Cervical: No cervical adenopathy.     Upper Body:     Right upper body: No supraclavicular or axillary adenopathy.     Left upper body: No supraclavicular or axillary adenopathy.     Lower Body: No right inguinal adenopathy. No left inguinal adenopathy.  Skin:    General: Skin is warm.     Coloration: Skin is not jaundiced.     Findings: No lesion or rash.  Neurological:     General: No focal deficit present.     Mental Status: He is alert and oriented to person, place, and time. Mental status is at baseline.  Psychiatric:        Mood and Affect: Mood normal.        Behavior: Behavior normal.  Thought Content: Thought content normal.    LABS:      Latest Ref Rng & Units 05/22/2023   10:32 AM 01/20/2023   10:31 AM 11/04/2022   10:56 AM  CBC  WBC 4.0 - 10.5 K/uL 7.6  6.3  7.8   Hemoglobin 13.0 - 17.0 g/dL 13.0  86.5  78.4   Hematocrit 39.0 - 52.0 % 38.1  35.6  40.4   Platelets 150 - 400 K/uL 83  106  101       Latest Ref Rng & Units 05/22/2023   10:32 AM 09/01/2022   10:28 AM 08/18/2022   10:38 AM  CMP  Glucose 70 - 99 mg/dL 98  81  88   BUN 6 - 20 mg/dL 5  7  8    Creatinine 0.61 - 1.24 mg/dL 6.96  2.95  2.84   Sodium 135 - 145 mmol/L 138  141  141   Potassium 3.5 - 5.1 mmol/L 3.9  4.2  3.8   Chloride 98 - 111 mmol/L 103  104  108   CO2 22 - 32 mmol/L 24  24  24    Calcium 8.9 - 10.3 mg/dL 9.0  9.6  9.2   Total  Protein 6.5 - 8.1 g/dL 7.3  7.7  7.5   Total Bilirubin 0.0 - 1.2 mg/dL 2.2  0.6  1.0   Alkaline Phos 38 - 126 U/L 314  124  117   AST 15 - 41 U/L 91  37  36   ALT 0 - 44 U/L 31  21  24     ASSESSMENT & PLAN:  Assessment/Plan:  A 53 y.o. male with thrombocytopenia.  When evaluating his labs today, his platelets are lower today than what they have been in the past.  However, this gentleman's liver enzymes are also worse.  I definitely believe that his underlying liver disease is playing a role into his thrombocytopenia.  Furthermore, this gentleman appears to be drinking alcohol very heavily, which has been the case for numerous years.  When this was discussed today, he was hoping there would be some way that he could be detoxed without having to be admitted.  I will talk to his primary care office about what they can do to help him with this.  With respect to his dysuria and penile erythema, a urinalysis and urine culture will be done today.  I will empirically place him on ciprofloxacin 500 mg twice daily x 5 days.  Overall, I made it very clear to the patient that his alcohol abuse is starting to lead to laboratory abnormalities consistent with liver damage.  If his drinking habits do not change, irreversible liver disease may be imminent within these next few years.  Although his platelets are lower than what they have been previously, he still has more than enough to do the clotting his body needs.  Based upon that, I will see him back in 6 months for repeat clinical assessment.  The patient understands all the plans discussed today and is in agreement with them.     Don Tiu Felicia Horde, MD

## 2023-05-22 ENCOUNTER — Other Ambulatory Visit: Payer: Self-pay | Admitting: Oncology

## 2023-05-22 ENCOUNTER — Inpatient Hospital Stay: Payer: Medicaid Other

## 2023-05-22 ENCOUNTER — Encounter: Payer: Self-pay | Admitting: Oncology

## 2023-05-22 ENCOUNTER — Other Ambulatory Visit (HOSPITAL_BASED_OUTPATIENT_CLINIC_OR_DEPARTMENT_OTHER): Payer: Self-pay

## 2023-05-22 ENCOUNTER — Inpatient Hospital Stay: Payer: Medicaid Other | Attending: Oncology | Admitting: Oncology

## 2023-05-22 VITALS — BP 147/80 | HR 65 | Temp 99.4°F | Resp 16 | Ht 71.0 in | Wt 156.8 lb

## 2023-05-22 DIAGNOSIS — R300 Dysuria: Secondary | ICD-10-CM | POA: Diagnosis not present

## 2023-05-22 DIAGNOSIS — D696 Thrombocytopenia, unspecified: Secondary | ICD-10-CM | POA: Insufficient documentation

## 2023-05-22 DIAGNOSIS — R319 Hematuria, unspecified: Secondary | ICD-10-CM | POA: Diagnosis not present

## 2023-05-22 DIAGNOSIS — K760 Fatty (change of) liver, not elsewhere classified: Secondary | ICD-10-CM | POA: Diagnosis not present

## 2023-05-22 DIAGNOSIS — Z79899 Other long term (current) drug therapy: Secondary | ICD-10-CM | POA: Diagnosis not present

## 2023-05-22 DIAGNOSIS — F1010 Alcohol abuse, uncomplicated: Secondary | ICD-10-CM | POA: Diagnosis not present

## 2023-05-22 DIAGNOSIS — E610 Copper deficiency: Secondary | ICD-10-CM | POA: Insufficient documentation

## 2023-05-22 LAB — CBC WITH DIFFERENTIAL (CANCER CENTER ONLY)
Abs Immature Granulocytes: 0.05 10*3/uL (ref 0.00–0.07)
Basophils Absolute: 0 10*3/uL (ref 0.0–0.1)
Basophils Relative: 0 %
Eosinophils Absolute: 0.1 10*3/uL (ref 0.0–0.5)
Eosinophils Relative: 1 %
HCT: 38.1 % — ABNORMAL LOW (ref 39.0–52.0)
Hemoglobin: 13.1 g/dL (ref 13.0–17.0)
Immature Granulocytes: 1 %
Immature Platelet Fraction: 3.7 % (ref 1.2–8.6)
Lymphocytes Relative: 28 %
Lymphs Abs: 2.1 10*3/uL (ref 0.7–4.0)
MCH: 32.5 pg (ref 26.0–34.0)
MCHC: 34.4 g/dL (ref 30.0–36.0)
MCV: 94.5 fL (ref 80.0–100.0)
Monocytes Absolute: 0.5 10*3/uL (ref 0.1–1.0)
Monocytes Relative: 7 %
Neutro Abs: 4.8 10*3/uL (ref 1.7–7.7)
Neutrophils Relative %: 63 %
Platelet Count: 83 10*3/uL — ABNORMAL LOW (ref 150–400)
RBC: 4.03 MIL/uL — ABNORMAL LOW (ref 4.22–5.81)
RDW: 17.2 % — ABNORMAL HIGH (ref 11.5–15.5)
WBC Count: 7.6 10*3/uL (ref 4.0–10.5)
nRBC: 0 % (ref 0.0–0.2)
nRBC: 0 /100{WBCs}

## 2023-05-22 LAB — URINALYSIS, COMPLETE (UACMP) WITH MICROSCOPIC
Glucose, UA: NEGATIVE mg/dL
Hgb urine dipstick: NEGATIVE
Ketones, ur: 40 mg/dL — AB
Leukocytes,Ua: NEGATIVE
Nitrite: NEGATIVE
Protein, ur: NEGATIVE mg/dL
Specific Gravity, Urine: 1.025 (ref 1.005–1.030)
pH: 6 (ref 5.0–8.0)

## 2023-05-22 LAB — CMP (CANCER CENTER ONLY)
ALT: 31 U/L (ref 0–44)
AST: 91 U/L — ABNORMAL HIGH (ref 15–41)
Albumin: 3.1 g/dL — ABNORMAL LOW (ref 3.5–5.0)
Alkaline Phosphatase: 314 U/L — ABNORMAL HIGH (ref 38–126)
Anion gap: 12 (ref 5–15)
BUN: <5 mg/dL — ABNORMAL LOW (ref 6–20)
CO2: 24 mmol/L (ref 22–32)
Calcium: 9 mg/dL (ref 8.9–10.3)
Chloride: 103 mmol/L (ref 98–111)
Creatinine: 0.81 mg/dL (ref 0.61–1.24)
GFR, Estimated: >60 mL/min (ref >60)
Glucose, Bld: 98 mg/dL (ref 70–99)
Potassium: 3.9 mmol/L (ref 3.5–5.1)
Sodium: 138 mmol/L (ref 135–145)
Total Bilirubin: 2.2 mg/dL — ABNORMAL HIGH (ref 0.0–1.2)
Total Protein: 7.3 g/dL (ref 6.5–8.1)

## 2023-05-22 MED ORDER — CIPROFLOXACIN HCL 500 MG PO TABS
500.0000 mg | ORAL_TABLET | Freq: Two times a day (BID) | ORAL | 0 refills | Status: AC
  Filled 2023-05-22: qty 10, 5d supply, fill #0

## 2023-05-23 LAB — COPPER, SERUM: Copper: 117 ug/dL (ref 69–132)

## 2023-05-24 LAB — URINE CULTURE

## 2023-07-04 ENCOUNTER — Other Ambulatory Visit: Payer: Self-pay | Admitting: Gastroenterology

## 2023-07-04 DIAGNOSIS — R7401 Elevation of levels of liver transaminase levels: Secondary | ICD-10-CM

## 2023-07-04 DIAGNOSIS — K760 Fatty (change of) liver, not elsewhere classified: Secondary | ICD-10-CM

## 2023-07-04 DIAGNOSIS — K709 Alcoholic liver disease, unspecified: Secondary | ICD-10-CM

## 2023-07-04 DIAGNOSIS — R17 Unspecified jaundice: Secondary | ICD-10-CM

## 2023-07-04 DIAGNOSIS — D696 Thrombocytopenia, unspecified: Secondary | ICD-10-CM

## 2023-07-10 ENCOUNTER — Ambulatory Visit
Admission: RE | Admit: 2023-07-10 | Discharge: 2023-07-10 | Disposition: A | Discharge status: Home / Self Care | Source: Ambulatory Visit | Attending: Gastroenterology | Admitting: Gastroenterology

## 2023-07-10 DIAGNOSIS — K709 Alcoholic liver disease, unspecified: Secondary | ICD-10-CM | POA: Diagnosis present

## 2023-07-10 DIAGNOSIS — R17 Unspecified jaundice: Secondary | ICD-10-CM | POA: Insufficient documentation

## 2023-07-10 DIAGNOSIS — R7401 Elevation of levels of liver transaminase levels: Secondary | ICD-10-CM | POA: Insufficient documentation

## 2023-07-10 DIAGNOSIS — D696 Thrombocytopenia, unspecified: Secondary | ICD-10-CM | POA: Diagnosis present

## 2023-07-10 DIAGNOSIS — K760 Fatty (change of) liver, not elsewhere classified: Secondary | ICD-10-CM | POA: Diagnosis present

## 2023-07-20 ENCOUNTER — Other Ambulatory Visit: Payer: Self-pay | Admitting: Gastroenterology

## 2023-07-20 DIAGNOSIS — K760 Fatty (change of) liver, not elsewhere classified: Secondary | ICD-10-CM

## 2023-07-20 DIAGNOSIS — R634 Abnormal weight loss: Secondary | ICD-10-CM

## 2023-07-20 DIAGNOSIS — R17 Unspecified jaundice: Secondary | ICD-10-CM

## 2023-07-20 DIAGNOSIS — K709 Alcoholic liver disease, unspecified: Secondary | ICD-10-CM

## 2023-07-20 DIAGNOSIS — R7401 Elevation of levels of liver transaminase levels: Secondary | ICD-10-CM

## 2023-07-20 DIAGNOSIS — F1010 Alcohol abuse, uncomplicated: Secondary | ICD-10-CM

## 2023-07-20 DIAGNOSIS — D696 Thrombocytopenia, unspecified: Secondary | ICD-10-CM

## 2023-07-26 ENCOUNTER — Ambulatory Visit
Admission: RE | Admit: 2023-07-26 | Discharge: 2023-07-26 | Disposition: A | Discharge status: Home / Self Care | Source: Ambulatory Visit | Attending: Gastroenterology | Admitting: Gastroenterology

## 2023-07-26 DIAGNOSIS — K760 Fatty (change of) liver, not elsewhere classified: Secondary | ICD-10-CM | POA: Insufficient documentation

## 2023-07-26 DIAGNOSIS — R7401 Elevation of levels of liver transaminase levels: Secondary | ICD-10-CM | POA: Insufficient documentation

## 2023-07-26 DIAGNOSIS — F1010 Alcohol abuse, uncomplicated: Secondary | ICD-10-CM | POA: Insufficient documentation

## 2023-07-26 DIAGNOSIS — D696 Thrombocytopenia, unspecified: Secondary | ICD-10-CM | POA: Insufficient documentation

## 2023-07-26 DIAGNOSIS — K709 Alcoholic liver disease, unspecified: Secondary | ICD-10-CM | POA: Insufficient documentation

## 2023-07-26 DIAGNOSIS — R17 Unspecified jaundice: Secondary | ICD-10-CM | POA: Diagnosis present

## 2023-07-26 DIAGNOSIS — R634 Abnormal weight loss: Secondary | ICD-10-CM | POA: Diagnosis present

## 2023-07-26 MED ORDER — GADOBUTROL 1 MMOL/ML IV SOLN
7.5000 mL | Freq: Once | INTRAVENOUS | Status: AC | PRN
  Administered 2023-07-26: 7.5 mL via INTRAVENOUS
